# Patient Record
Sex: Female | Born: 1995 | Race: White | Hispanic: No | Marital: Single | Smoking: Never smoker
Health system: Southern US, Community
[De-identification: ages and names within clinical notes are randomized; demographics above are authoritative.]

## PROBLEM LIST (undated history)

## (undated) DIAGNOSIS — N83209 Unspecified ovarian cyst, unspecified side: Secondary | ICD-10-CM

## (undated) DIAGNOSIS — F32A Depression, unspecified: Secondary | ICD-10-CM

## (undated) DIAGNOSIS — D649 Anemia, unspecified: Secondary | ICD-10-CM

## (undated) DIAGNOSIS — G43909 Migraine, unspecified, not intractable, without status migrainosus: Secondary | ICD-10-CM

## (undated) DIAGNOSIS — J45909 Unspecified asthma, uncomplicated: Secondary | ICD-10-CM

## (undated) DIAGNOSIS — B009 Herpesviral infection, unspecified: Secondary | ICD-10-CM

## (undated) DIAGNOSIS — F329 Major depressive disorder, single episode, unspecified: Secondary | ICD-10-CM

## (undated) HISTORY — PX: TONSILLECTOMY: SUR1361

## (undated) HISTORY — PX: LAPAROSCOPIC OVARIAN CYSTECTOMY: SUR786

---

## 2017-08-11 ENCOUNTER — Other Ambulatory Visit: Payer: Self-pay

## 2017-08-11 ENCOUNTER — Emergency Department (HOSPITAL_BASED_OUTPATIENT_CLINIC_OR_DEPARTMENT_OTHER)
Admission: EM | Admit: 2017-08-11 | Discharge: 2017-08-12 | Disposition: A | Discharge status: Home / Self Care | Payer: BLUE CROSS/BLUE SHIELD | Attending: Emergency Medicine | Admitting: Emergency Medicine

## 2017-08-11 ENCOUNTER — Encounter (HOSPITAL_BASED_OUTPATIENT_CLINIC_OR_DEPARTMENT_OTHER): Payer: Self-pay | Admitting: Emergency Medicine

## 2017-08-11 DIAGNOSIS — R51 Headache: Secondary | ICD-10-CM | POA: Diagnosis present

## 2017-08-11 DIAGNOSIS — J45909 Unspecified asthma, uncomplicated: Secondary | ICD-10-CM | POA: Diagnosis not present

## 2017-08-11 DIAGNOSIS — G43009 Migraine without aura, not intractable, without status migrainosus: Secondary | ICD-10-CM

## 2017-08-11 DIAGNOSIS — R197 Diarrhea, unspecified: Secondary | ICD-10-CM | POA: Diagnosis not present

## 2017-08-11 HISTORY — DX: Unspecified asthma, uncomplicated: J45.909

## 2017-08-11 HISTORY — DX: Anemia, unspecified: D64.9

## 2017-08-11 HISTORY — DX: Major depressive disorder, single episode, unspecified: F32.9

## 2017-08-11 HISTORY — DX: Depression, unspecified: F32.A

## 2017-08-11 HISTORY — DX: Migraine, unspecified, not intractable, without status migrainosus: G43.909

## 2017-08-11 LAB — PREGNANCY, URINE: Preg Test, Ur: NEGATIVE

## 2017-08-11 LAB — URINALYSIS, ROUTINE W REFLEX MICROSCOPIC
BILIRUBIN URINE: NEGATIVE
GLUCOSE, UA: NEGATIVE mg/dL
HGB URINE DIPSTICK: NEGATIVE
Ketones, ur: 15 mg/dL — AB
Leukocytes, UA: NEGATIVE
Nitrite: NEGATIVE
Protein, ur: NEGATIVE mg/dL
SPECIFIC GRAVITY, URINE: 1.02 (ref 1.005–1.030)
pH: 6.5 (ref 5.0–8.0)

## 2017-08-11 MED ORDER — KETOROLAC TROMETHAMINE 30 MG/ML IJ SOLN
15.0000 mg | Freq: Once | INTRAMUSCULAR | Status: AC
  Administered 2017-08-11: 15 mg via INTRAVENOUS
  Filled 2017-08-11: qty 1

## 2017-08-11 MED ORDER — DICYCLOMINE HCL 10 MG/ML IM SOLN
20.0000 mg | Freq: Once | INTRAMUSCULAR | Status: AC
  Administered 2017-08-11: 20 mg via INTRAMUSCULAR
  Filled 2017-08-11: qty 2

## 2017-08-11 MED ORDER — DIPHENHYDRAMINE HCL 50 MG/ML IJ SOLN: 25.0000 mg | Freq: Once | INTRAMUSCULAR | Status: DC

## 2017-08-11 MED ORDER — SODIUM CHLORIDE 0.9 % IV BOLUS (SEPSIS)
1000.0000 mL | Freq: Once | INTRAVENOUS | Status: AC
  Administered 2017-08-11: 1000 mL via INTRAVENOUS

## 2017-08-11 MED ORDER — ONDANSETRON HCL 4 MG/2ML IJ SOLN
4.0000 mg | Freq: Once | INTRAMUSCULAR | Status: AC
  Administered 2017-08-11: 4 mg via INTRAVENOUS
  Filled 2017-08-11: qty 2

## 2017-08-11 NOTE — ED Triage Notes (Signed)
PT presents with c/o migraine for 3 days. PT states she received infusion yesterday but headache is back and worse.

## 2017-08-12 MED ORDER — METOCLOPRAMIDE HCL 10 MG PO TABS: 10.0000 mg | ORAL_TABLET | Freq: Four times a day (QID) | ORAL | 0 refills | Status: AC | PRN

## 2017-08-12 MED ORDER — MAGNESIUM SULFATE 2 GM/50ML IV SOLN
2.0000 g | Freq: Once | INTRAVENOUS | Status: AC
  Administered 2017-08-12: 2 g via INTRAVENOUS
  Filled 2017-08-12: qty 50

## 2017-08-12 MED ORDER — PROMETHAZINE HCL 25 MG/ML IJ SOLN
12.5000 mg | Freq: Once | INTRAMUSCULAR | Status: AC
  Administered 2017-08-12: 12.5 mg via INTRAVENOUS
  Filled 2017-08-12: qty 1

## 2017-08-12 MED ORDER — DIPHENHYDRAMINE HCL 25 MG PO CAPS
25.0000 mg | ORAL_CAPSULE | Freq: Once | ORAL | Status: AC
  Administered 2017-08-12: 25 mg via ORAL
  Filled 2017-08-12: qty 1

## 2017-08-12 MED ORDER — DEXAMETHASONE SODIUM PHOSPHATE 4 MG/ML IJ SOLN
4.0000 mg | Freq: Once | INTRAMUSCULAR | Status: AC
  Administered 2017-08-12: 4 mg via INTRAVENOUS
  Filled 2017-08-12: qty 1

## 2017-08-12 NOTE — ED Provider Notes (Signed)
[facility] EMERGENCY DEPARTMENT Provider Note   CSN: [id number] Arrival date & time: 08/11/17  2140     History   Chief Complaint Chief Complaint  Patient presents with  . Headache    HPI Whitney Parker is a 21 y.o. female.  The history is provided by the patient.  Headache   This is a recurrent problem. The current episode started more than 2 days ago. The problem occurs constantly. The problem has not changed since onset.The headache is associated with nothing. The pain is located in the temporal region. The quality of the pain is described as dull. The pain is severe. The pain does not radiate. Associated symptoms include nausea and vomiting. Pertinent negatives include no anorexia, no fever, no malaise/fatigue, no chest pressure and no syncope. Associated symptoms comments: Diarrhea.  Has known migraines and got an injection in the doctors office and was better but then today got worse with n/v/d.  No neck pain,  No focal neuro signs. Home medication not enough.  No sudden onset not the worst HA of her life.  No changes in vision or cognition. Treatments tried: phenergan  The treatment provided no relief.    Past Medical History:  Diagnosis Date  . Anemia   . Asthma   . Depression   . Migraines     There are no active problems to display for this patient.   Past Surgical History:  Procedure Laterality Date  . TONSILLECTOMY      OB History    No data available       Home Medications    Prior to Admission medications   Medication Sig Start Date End Date Taking? Authorizing Provider  metoCLOPramide (REGLAN) 10 MG tablet Take 1 tablet (10 mg total) every 6 (six) hours as needed by mouth for nausea (nausea/headache). 08/12/17   Brandie Lopes, MD    Family History No family history on file.  Social History Social History   Tobacco Use  . Smoking status: Never Smoker  . Smokeless tobacco: Never Used  Substance Use Topics  . Alcohol use: Yes   Frequency: Never  . Drug use: No     Allergies   Other; Penicillins; Percocet [oxycodone-acetaminophen]; Sulfa antibiotics; and Topamax [topiramate]   Review of Systems Review of Systems  Constitutional: Negative for fever and malaise/fatigue.  Eyes: Negative for pain, redness and visual disturbance.  Cardiovascular: Negative for syncope.  Gastrointestinal: Positive for diarrhea, nausea and vomiting. Negative for anorexia.  Musculoskeletal: Negative for neck pain and neck stiffness.  Skin: Negative for rash.  Neurological: Positive for headaches. Negative for dizziness, tremors, seizures, syncope, facial asymmetry, speech difficulty, weakness, light-headedness and numbness.  Psychiatric/Behavioral: Negative for confusion.  All other systems reviewed and are negative.    Physical Exam Updated Vital Signs BP 122/74 (BP Location: Right Arm)   Pulse 84   Temp 97.9 F (36.6 C) (Oral)   Resp 18   Ht 5\' 6"  (1.676 m)   Wt 63.5 kg (140 lb)   LMP 07/16/2017   SpO2 100%   BMI 22.60 kg/m   Physical Exam  Constitutional: She is oriented to person, place, and time. She appears well-developed and well-nourished.  Non-toxic appearance. She does not appear ill. No distress.  HENT:  Head: Normocephalic and atraumatic.  Mouth/Throat: Oropharynx is clear and moist.  Eyes: EOM are normal. Pupils are equal, round, and reactive to light.  No proptosis intact cognition  Neck: Normal range of motion. Neck supple. No JVD present.  No neck rigidity. No tracheal deviation present. No Brudzinski's sign and no Kernig's sign noted.  Cardiovascular: Normal rate, regular rhythm, normal heart sounds and intact distal pulses.  Pulmonary/Chest: Effort normal and breath sounds normal.  Abdominal: Soft. Bowel sounds are normal. There is no tenderness.  Musculoskeletal: Normal range of motion.  Lymphadenopathy:    She has no cervical adenopathy.  Neurological: She is alert and oriented to person, place,  and time. She has normal strength. She displays normal reflexes. No cranial nerve deficit. She displays a negative Romberg sign. GCS eye subscore is 4. GCS verbal subscore is 5. GCS motor subscore is 6.  Skin: Skin is warm and dry. Capillary refill takes less than 2 seconds.  Psychiatric: She has a normal mood and affect. Her behavior is normal.  Nursing note and vitals reviewed.    ED Treatments / Results   Vitals:   08/12/17 0211 08/12/17 0246  BP: 125/73 122/74  Pulse: 82 84  Resp: 16 18  Temp:    SpO2: 100% 100%    Radiology No results found.  Procedures Procedures (including critical care time)  Medications Ordered in ED Medications  ketorolac (TORADOL) 30 MG/ML injection 15 mg (15 mg Intravenous Given 08/11/17 2355)  sodium chloride 0.9 % bolus 1,000 mL (0 mLs Intravenous Stopped 08/12/17 0049)  ondansetron (ZOFRAN) injection 4 mg (4 mg Intravenous Given 08/11/17 2354)  dicyclomine (BENTYL) injection 20 mg (20 mg Intramuscular Given 08/11/17 2355)  dexamethasone (DECADRON) injection 4 mg (4 mg Intravenous Given 08/12/17 0029)  diphenhydrAMINE (BENADRYL) capsule 25 mg (25 mg Oral Given 08/12/17 0029)  magnesium sulfate IVPB 2 g 50 mL (0 g Intravenous Stopped 08/12/17 0209)  promethazine (PHENERGAN) injection 12.5 mg (12.5 mg Intravenous Given 08/12/17 0126)      Final Clinical Impressions(s) / ED Diagnoses   Final diagnoses:  Migraine without aura and without status migrainosus, not intractable  Diarrhea, unspecified type   Improved post medication.  Suspect viral induced HA on top of migraine give n/v.d.    All questions answered to the patient's satisfaction.    Strict return precautions for fever, global weakness, blood in the urine, abdominal distention, vomiting, no drainage from the foley catheter, swelling or the lips or tongue, chest pain, dyspnea on exertion, new weakness or numbness changes in vision or speech, fevers, weakness persistent pain, Inability  to tolerate liquids or food, changes in voice cough, altered mental status or any concerns. No signs of systemic illness or infection. The patient is nontoxic-appearing on exam and vital signs are within normal limits.    I have reviewed the triage vital signs and the nursing notes. Pertinent labs &imaging results that were available during my care of the patient were reviewed by me and considered in my medical decision making (see chart for details).  After history, exam, and medical workup I feel the patient has been appropriately medically screened and is safe for discharge home. Pertinent diagnoses were discussed with the patient. Patient was given return precautions ED Discharge Orders        Ordered    metoCLOPramide (REGLAN) 10 MG tablet  Every 6 hours PRN     08/12/17 0238       Domingos Riggi, MD 08/12/17 [phone]

## 2017-08-12 NOTE — ED Notes (Signed)
Pt discharged to home with family. NAD.  

## 2018-01-28 ENCOUNTER — Other Ambulatory Visit: Payer: Self-pay

## 2018-01-28 ENCOUNTER — Emergency Department (HOSPITAL_COMMUNITY)
Admission: EM | Admit: 2018-01-28 | Discharge: 2018-01-28 | Disposition: A | Discharge status: Home / Self Care | Payer: BLUE CROSS/BLUE SHIELD | Attending: Emergency Medicine | Admitting: Emergency Medicine

## 2018-01-28 ENCOUNTER — Emergency Department (HOSPITAL_COMMUNITY): Payer: BLUE CROSS/BLUE SHIELD

## 2018-01-28 ENCOUNTER — Encounter (HOSPITAL_COMMUNITY): Payer: Self-pay | Admitting: Emergency Medicine

## 2018-01-28 DIAGNOSIS — N10 Acute tubulo-interstitial nephritis: Secondary | ICD-10-CM | POA: Insufficient documentation

## 2018-01-28 DIAGNOSIS — Z79899 Other long term (current) drug therapy: Secondary | ICD-10-CM | POA: Insufficient documentation

## 2018-01-28 DIAGNOSIS — N939 Abnormal uterine and vaginal bleeding, unspecified: Secondary | ICD-10-CM | POA: Insufficient documentation

## 2018-01-28 DIAGNOSIS — R109 Unspecified abdominal pain: Secondary | ICD-10-CM | POA: Diagnosis present

## 2018-01-28 DIAGNOSIS — J45909 Unspecified asthma, uncomplicated: Secondary | ICD-10-CM | POA: Diagnosis not present

## 2018-01-28 LAB — COMPREHENSIVE METABOLIC PANEL
ALT: 10 U/L — ABNORMAL LOW (ref 14–54)
AST: 18 U/L (ref 15–41)
Albumin: 4 g/dL (ref 3.5–5.0)
Alkaline Phosphatase: 67 U/L (ref 38–126)
Anion gap: 9 (ref 5–15)
BUN: 8 mg/dL (ref 6–20)
CO2: 24 mmol/L (ref 22–32)
Calcium: 8.6 mg/dL — ABNORMAL LOW (ref 8.9–10.3)
Chloride: 104 mmol/L (ref 101–111)
Creatinine, Ser: 0.7 mg/dL (ref 0.44–1.00)
GFR calc Af Amer: >60 mL/min (ref >60)
GFR calc non Af Amer: >60 mL/min (ref >60)
Glucose, Bld: 99 mg/dL (ref 65–99)
Potassium: 3.9 mmol/L (ref 3.5–5.1)
Sodium: 137 mmol/L (ref 135–145)
Total Bilirubin: 0.8 mg/dL (ref 0.3–1.2)
Total Protein: 6.2 g/dL — ABNORMAL LOW (ref 6.5–8.1)

## 2018-01-28 LAB — CBC WITH DIFFERENTIAL/PLATELET
Basophils Absolute: 0 10*3/uL (ref 0.0–0.1)
Basophils Relative: 0 %
Eosinophils Absolute: 0.8 10*3/uL — ABNORMAL HIGH (ref 0.0–0.7)
Eosinophils Relative: 5 %
HCT: 30.9 % — ABNORMAL LOW (ref 36.0–46.0)
Hemoglobin: 9.9 g/dL — ABNORMAL LOW (ref 12.0–15.0)
Lymphocytes Relative: 15 %
Lymphs Abs: 2.4 10*3/uL (ref 0.7–4.0)
MCH: 19.9 pg — ABNORMAL LOW (ref 26.0–34.0)
MCHC: 32 g/dL (ref 30.0–36.0)
MCV: 62.2 fL — ABNORMAL LOW (ref 78.0–100.0)
Monocytes Absolute: 0.6 10*3/uL (ref 0.1–1.0)
Monocytes Relative: 4 %
Neutro Abs: 12.2 10*3/uL — ABNORMAL HIGH (ref 1.7–7.7)
Neutrophils Relative %: 76 %
Platelets: 316 10*3/uL (ref 150–400)
RBC: 4.97 MIL/uL (ref 3.87–5.11)
RDW: 14.9 % (ref 11.5–15.5)
WBC: 16 10*3/uL — ABNORMAL HIGH (ref 4.0–10.5)

## 2018-01-28 LAB — WET PREP, GENITAL
Sperm: NONE SEEN
Trich, Wet Prep: NONE SEEN
Yeast Wet Prep HPF POC: NONE SEEN

## 2018-01-28 LAB — URINALYSIS, ROUTINE W REFLEX MICROSCOPIC
Bilirubin Urine: NEGATIVE
Glucose, UA: NEGATIVE mg/dL
Ketones, ur: NEGATIVE mg/dL
NITRITE: NEGATIVE
PH: 5 (ref 5.0–8.0)
Protein, ur: NEGATIVE mg/dL
SPECIFIC GRAVITY, URINE: 1.014 (ref 1.005–1.030)

## 2018-01-28 LAB — LIPASE, BLOOD: Lipase: 26 U/L (ref 11–51)

## 2018-01-28 LAB — POC URINE PREG, ED: Preg Test, Ur: NEGATIVE

## 2018-01-28 MED ORDER — CEPHALEXIN 500 MG PO CAPS: 500.0000 mg | ORAL_CAPSULE | Freq: Four times a day (QID) | ORAL | 0 refills | Status: AC

## 2018-01-28 MED ORDER — ONDANSETRON 4 MG PO TBDP: 4.0000 mg | ORAL_TABLET | Freq: Three times a day (TID) | ORAL | 0 refills | Status: DC | PRN

## 2018-01-28 MED ORDER — SODIUM CHLORIDE 0.9 % IV BOLUS
1000.0000 mL | Freq: Once | INTRAVENOUS | Status: AC
  Administered 2018-01-28: 1000 mL via INTRAVENOUS

## 2018-01-28 MED ORDER — PROMETHAZINE HCL 25 MG/ML IJ SOLN
12.5000 mg | Freq: Once | INTRAMUSCULAR | Status: AC
  Administered 2018-01-28: 12.5 mg via INTRAVENOUS
  Filled 2018-01-28: qty 1

## 2018-01-28 MED ORDER — ONDANSETRON HCL 4 MG/2ML IJ SOLN
4.0000 mg | Freq: Once | INTRAMUSCULAR | Status: AC
  Administered 2018-01-28: 4 mg via INTRAVENOUS
  Filled 2018-01-28: qty 2

## 2018-01-28 MED ORDER — SODIUM CHLORIDE 0.9 % IV SOLN
1.0000 g | Freq: Once | INTRAVENOUS | Status: AC
  Administered 2018-01-28: 1 g via INTRAVENOUS
  Filled 2018-01-28: qty 10

## 2018-01-28 MED ORDER — KETOROLAC TROMETHAMINE 30 MG/ML IJ SOLN
30.0000 mg | Freq: Once | INTRAMUSCULAR | Status: AC
  Administered 2018-01-28: 30 mg via INTRAVENOUS
  Filled 2018-01-28: qty 1

## 2018-01-28 NOTE — ED Notes (Signed)
Patient transported to CT 

## 2018-01-28 NOTE — ED Provider Notes (Signed)
[facility] EMERGENCY DEPARTMENT Provider Note   CSN: [id number] Arrival date & time: 01/28/18  4098     History   Chief Complaint Chief Complaint  Patient presents with  . Abdominal Pain  . Back Pain  . Dysuria    HPI Whitney Parker is a 22 y.o. female with history of anemia, asthma, depression, and migraines presents today for evaluation of urinary symptoms for 3 days and acute onset, per aggressively worsening back pain and right flank pain since yesterday.  She states that for the past 3 days she has experienced dysuria, urgency, and frequency.  She also notes mild suprapubic pressure.  Yesterday she developed generalized myalgias and chills, aching midline low back pain and right flank pain.  No radiation down the lower extremity's.  Pain worsens with bending and position changes.  She has experienced 4 episodes of nonbloody nonbilious emesis and endorses ongoing nausea.  She denies diarrhea but endorses constipation and states this is normal for her because she has IBS.  She has been expensing abnormal vaginal bleeding additionally.  She states that she did not have a menstrual cycle for 2 months and then bled for 2 weeks straight from 01/02/2018 through 01/18/2018.  She states she began having vaginal spotting again today.  She does take progesterone-based OCPs and states that her menstrual cycle was regulated for a long time but has been more irregular as of late.  She is currently sexually active with one female partner but does not always use protection.  She declines testing for HIV or syphilis today and states she does not think she has an STD.  She has not tried anything for her symptoms.  The history is provided by the patient.    Past Medical History:  Diagnosis Date  . Anemia   . Asthma   . Depression   . Migraines     There are no active problems to display for this patient.   Past Surgical History:  Procedure Laterality Date  . TONSILLECTOMY        OB History   None      Home Medications    Prior to Admission medications   Medication Sig Start Date End Date Taking? Authorizing Provider  montelukast (SINGULAIR) 10 MG tablet Take 10 mg by mouth at bedtime.   Yes [provider]  norethindrone (MICRONOR,CAMILA,ERRIN) 0.35 MG tablet Take 1 tablet by mouth daily.   Yes [provider]  PARoxetine (PAXIL) 20 MG tablet Take 20 mg by mouth daily.   Yes [provider]  tiZANidine (ZANAFLEX) 4 MG tablet Take 4-8 mg by mouth at bedtime. 06/01/17  Yes [provider]  zonisamide (ZONEGRAN) 100 MG capsule Take 500 mg by mouth at bedtime. 12/20/17  Yes [provider]  cephALEXin (KEFLEX) 500 MG capsule Take 1 capsule (500 mg total) by mouth 4 (four) times daily for 10 days. 01/28/18 02/07/18  Michela Pitcher A, PA-C  metoCLOPramide (REGLAN) 10 MG tablet Take 1 tablet (10 mg total) every 6 (six) hours as needed by mouth for nausea (nausea/headache). Patient not taking: Reported on 01/28/2018 08/12/17   Palumbo, April, MD  ondansetron (ZOFRAN ODT) 4 MG disintegrating tablet Take 1 tablet (4 mg total) by mouth every 8 (eight) hours as needed for nausea or vomiting. 01/28/18   Jeanie Sewer, PA-C    Family History No family history on file.  Social History Social History   Tobacco Use  . Smoking status: Never Smoker  .  Smokeless tobacco: Never Used  Substance Use Topics  . Alcohol use: Yes    Frequency: Never  . Drug use: No     Allergies   Compazine [prochlorperazine edisylate]; Other; Penicillins; Percocet [oxycodone-acetaminophen]; Sulfa antibiotics; and Topamax [topiramate]   Review of Systems Review of Systems  Constitutional: Positive for chills and fatigue. Negative for fever.  Respiratory: Negative for shortness of breath.   Cardiovascular: Negative for chest pain.  Gastrointestinal: Positive for abdominal pain, nausea and vomiting. Negative for diarrhea.  Genitourinary: Positive for  dysuria, flank pain, frequency, urgency and vaginal bleeding. Negative for hematuria, vaginal discharge and vaginal pain.  Musculoskeletal: Positive for arthralgias.  All other systems reviewed and are negative.    Physical Exam Updated Vital Signs BP 118/74   Pulse 95   Temp 98 F (36.7 C) (Oral)   Resp 16   Ht  (1.676 m)   Wt 59 kg (130 lb)   LMP 01/17/2018   SpO2 98%   BMI 20.98 kg/m   Physical Exam  Constitutional: She appears well-developed and well-nourished. No distress.  HENT:  Head: Normocephalic and atraumatic.  Eyes: Conjunctivae are normal. Right eye exhibits no discharge. Left eye exhibits no discharge.  Neck: No JVD present. No tracheal deviation present.  Cardiovascular: Normal rate, regular rhythm, normal heart sounds and intact distal pulses.  Pulmonary/Chest: Effort normal and breath sounds normal. No stridor. No respiratory distress.  Abdominal: Soft. Normal appearance and bowel sounds are normal. She exhibits no distension. There is tenderness in the suprapubic area and left lower quadrant. There is CVA tenderness. There is no rigidity, no rebound, no guarding, no tenderness at McBurney's point and negative Murphy's sign.  Right CVA tenderness noted  Genitourinary: Rectum normal and uterus normal. Cervix exhibits no motion tenderness and no friability. Right adnexum displays no tenderness. Left adnexum displays no tenderness. There is bleeding in the vagina.  Genitourinary Comments: Examination performed in the presence of a chaperone.  No masses or lesions noted to the external genitalia.  There is a moderate amount of blood in the vaginal vault.  No other abnormal discharge noted.  No cervical motion tenderness, no adnexal tenderness.  Musculoskeletal: She exhibits no edema.  Diffuse lumbar pain on palpation, worse on the right.  No deformity, crepitus, or step-off noted.  5/5 strength of BLE major muscle groups.  Neurological: She is alert.  Skin: Skin  is warm and dry. No erythema.  Psychiatric: She has a normal mood and affect. Her behavior is normal.  Nursing note and vitals reviewed.    ED Treatments / Results  Labs (all labs ordered are listed, but only abnormal results are displayed) Labs Reviewed  URINE CULTURE - Abnormal; Notable for the following components:      Result Value   Culture MULTIPLE SPECIES PRESENT, SUGGEST RECOLLECTION (*)    All other components within normal limits  WET PREP, GENITAL - Abnormal; Notable for the following components:   Clue Cells Wet Prep HPF POC PRESENT (*)    WBC, Wet Prep HPF POC MODERATE (*)    All other components within normal limits  URINALYSIS, ROUTINE W REFLEX MICROSCOPIC - Abnormal; Notable for the following components:   APPearance HAZY (*)    Hgb urine dipstick LARGE (*)    Leukocytes, UA LARGE (*)    WBC, UA >50 (*)    Bacteria, UA MANY (*)    All other components within normal limits  COMPREHENSIVE METABOLIC PANEL - Abnormal; Notable for the following  components:   Calcium 8.6 (*)    Total Protein 6.2 (*)    ALT 10 (*)    All other components within normal limits  CBC WITH DIFFERENTIAL/PLATELET - Abnormal; Notable for the following components:   WBC 16.0 (*)    Hemoglobin 9.9 (*)    HCT 30.9 (*)    MCV 62.2 (*)    MCH 19.9 (*)    Neutro Abs 12.2 (*)    Eosinophils Absolute 0.8 (*)    All other components within normal limits  LIPASE, BLOOD  POC URINE PREG, ED  GC/CHLAMYDIA PROBE AMP ([place]) NOT AT [facility]    EKG None  Radiology Ct Renal Stone Study  Result Date: 01/28/2018 CLINICAL DATA:  22 year old female with history of urinary tract infection. EXAM: CT ABDOMEN AND PELVIS WITHOUT CONTRAST TECHNIQUE: Multidetector CT imaging of the abdomen and pelvis was performed following the standard protocol without IV contrast. COMPARISON:  No priors. FINDINGS: Lower chest: Unremarkable. Hepatobiliary: No definite cystic or solid hepatic lesions noted on today's  noncontrast CT examination. Unenhanced appearance of the gallbladder is normal. Pancreas: No definite pancreatic mass or peripancreatic fluid or inflammatory changes are noted on today's noncontrast CT examination. Spleen: Unremarkable. Adrenals/Urinary Tract: 3 mm nonobstructive calculus in the interpolar collecting system of the left kidney. No additional calculi are noted within the right renal collecting system, along the course of either ureter, or within the lumen of the urinary bladder. No hydroureteronephrosis or perinephric stranding to suggest urinary tract obstruction at this time. Unenhanced appearance of the kidneys, bilateral adrenal glands and urinary bladder is otherwise unremarkable. Stomach/Bowel: Unenhanced appearance of the stomach is normal. There is no pathologic dilatation of small bowel or colon. Normal appendix. Vascular/Lymphatic: No atherosclerotic calcifications are. No lymphadenopathy noted in the abdominal or pelvic vasculature noted in the abdomen or pelvis. Reproductive: Uterus and ovaries are unremarkable in appearance. Other: No significant volume of ascites.  No pneumoperitoneum. Musculoskeletal: There are no aggressive appearing lytic or blastic lesions noted in the visualized portions of the skeleton. IMPRESSION: 1. No acute findings are noted in the abdomen or pelvis to account for the patient's symptoms. 2. 3 mm nonobstructive calculus in the interpolar collecting system of left kidney. No ureteral stones or findings of urinary tract obstruction are noted at this time. Electronically Signed   By: Trudie Reed M.D.   On: 01/28/2018 14:55    Procedures Procedures (including critical care time)  Medications Ordered in ED Medications  sodium chloride 0.9 % bolus 1,000 mL (0 mLs Intravenous Stopped 01/28/18 1318)  promethazine (PHENERGAN) injection 12.5 mg (12.5 mg Intravenous Given 01/28/18 1157)  ketorolac (TORADOL) 30 MG/ML injection 30 mg (30 mg Intravenous Given  01/28/18 1322)  cefTRIAXone (ROCEPHIN) 1 g in sodium chloride 0.9 % 100 mL IVPB (0 g Intravenous Stopped 01/28/18 1602)  ondansetron (ZOFRAN) injection 4 mg (4 mg Intravenous Given 01/28/18 1532)     Initial Impression / Assessment and Plan / ED Course  I have reviewed the triage vital signs and the nursing notes.  Pertinent labs & imaging results that were available during my care of the patient were reviewed by me and considered in my medical decision making (see chart for details).     Patient presents for evaluation of 3 days of urinary symptoms and 1 day of back pain nausea, and vomiting.  She has also been having abnormal vaginal bleeding intermittently for months.  She is afebrile, mildly tachycardic in the ED but vital signs are  otherwise stable.  She is somewhat uncomfortable but nontoxic in appearance.  She has right-sided CVA tenderness and mild suprapubic pressure on examination.  She has small amount of vaginal bleeding on pelvic examination but no cervical motion tenderness or adnexal tenderness to suggest PID.  Pregnancy test is negative and I doubt ectopic pregnancy as a result.  UA is consistent with UTI, will send for culture.  With concern for pyelonephritis given the patient's constitutional symptoms and back pain, we will obtain CT renal stone study to rule out abscess or complication.  We will also give fluids, Toradol, and nausea medicine.  CT scan shows a 3 mm nonobstructing kidney stone in the left kidney.  Remainder of lab work reviewed by me significant for a leukocytosis of 16, and clue cells present on wet prep.  However since patient is not complaining of vaginal discharge and clue cells are physiologic, I do not see an emergent need to treat for BV.  I doubt obstruction, perforation, appendicitis, colitis, TOA, ovarian torsion, or other acute surgical abdominal pathology. She was given IV Rocephin in the ED which she tolerated without difficulty.  In the past she has had  itching with amoxicillin but no evidence of cross-reactivity today.  Will discharge with Keflex, Zofran, and I have instructed her to follow-up with her primary care physician or OB/GYN for reevaluation of her current symptoms and her abnormal vaginal bleeding.  Suspect this may be secondary to her progesterone only OCPs.  On reevaluation, the patient is resting comfortably in no apparent distress, tolerating p.o. food and fluids without difficulty.  Serial abdominal examinations remain benign.  Discussed strict ED return precautions. Pt verbalized understanding of and agreement with plan and is safe for discharge home at this time.   Final Clinical Impressions(s) / ED Diagnoses   Final diagnoses:  Abnormal vaginal bleeding  Acute pyelonephritis    ED Discharge Orders        Ordered    cephALEXin (KEFLEX) 500 MG capsule  4 times daily     01/28/18 1638    ondansetron (ZOFRAN ODT) 4 MG disintegrating tablet  Every 8 hours PRN     01/28/18 1638       Mykell Genao, [place] A, PA-C 01/30/18 1020    Terrilee Files, MD 01/30/18 1705

## 2018-01-28 NOTE — Discharge Instructions (Addendum)
Please take all of your antibiotics until finished!   You may develop abdominal discomfort or diarrhea from the antibiotic.  You may help offset this with probiotics which you can buy or get in yogurt. Do not eat  or take the probiotics until 2 hours after your antibiotic.   Alternate 600 mg of ibuprofen and [phone] mg of Tylenol every 3 hours as needed for pain. Do not exceed 4000 mg of Tylenol daily.  Take ibuprofen with food to avoid upset stomach issues.  Take Zofran as needed for nausea.  Wait around 20 minutes to give this medication time to work.  Eat a diet of bland foods that will not upset your stomach.  Call your primary care physician and set up a follow-up appointment sometime this week for reevaluation of your current symptoms as well as your abnormal vaginal bleeding.  Return to the emergency department for any concerning signs or symptoms or worsening symptoms such as fevers, persistent vomiting, or worsening pain.

## 2018-01-28 NOTE — ED Triage Notes (Signed)
Pt. Stated, I have a UTI, I have them a lot. My back, stomach legs all hurt and it burns when I pee, started yesterday

## 2018-01-29 LAB — URINE CULTURE

## 2018-01-29 LAB — GC/CHLAMYDIA PROBE AMP (~~LOC~~) NOT AT ARMC
Chlamydia: NEGATIVE
Neisseria Gonorrhea: NEGATIVE

## 2018-10-31 ENCOUNTER — Emergency Department (HOSPITAL_BASED_OUTPATIENT_CLINIC_OR_DEPARTMENT_OTHER)
Admission: EM | Admit: 2018-10-31 | Discharge: 2018-11-01 | Disposition: A | Discharge status: Home / Self Care | Payer: BLUE CROSS/BLUE SHIELD | Attending: Emergency Medicine | Admitting: Emergency Medicine

## 2018-10-31 ENCOUNTER — Emergency Department (HOSPITAL_BASED_OUTPATIENT_CLINIC_OR_DEPARTMENT_OTHER): Payer: BLUE CROSS/BLUE SHIELD

## 2018-10-31 ENCOUNTER — Encounter (HOSPITAL_BASED_OUTPATIENT_CLINIC_OR_DEPARTMENT_OTHER): Payer: Self-pay | Admitting: Emergency Medicine

## 2018-10-31 ENCOUNTER — Other Ambulatory Visit: Payer: Self-pay

## 2018-10-31 DIAGNOSIS — R102 Pelvic and perineal pain unspecified side: Secondary | ICD-10-CM

## 2018-10-31 DIAGNOSIS — Z91040 Latex allergy status: Secondary | ICD-10-CM | POA: Diagnosis not present

## 2018-10-31 DIAGNOSIS — R1032 Left lower quadrant pain: Secondary | ICD-10-CM | POA: Diagnosis not present

## 2018-10-31 DIAGNOSIS — K5900 Constipation, unspecified: Secondary | ICD-10-CM | POA: Diagnosis not present

## 2018-10-31 DIAGNOSIS — J45909 Unspecified asthma, uncomplicated: Secondary | ICD-10-CM | POA: Diagnosis not present

## 2018-10-31 DIAGNOSIS — R110 Nausea: Secondary | ICD-10-CM | POA: Insufficient documentation

## 2018-10-31 DIAGNOSIS — Z880 Allergy status to penicillin: Secondary | ICD-10-CM | POA: Diagnosis not present

## 2018-10-31 DIAGNOSIS — Z8742 Personal history of other diseases of the female genital tract: Secondary | ICD-10-CM | POA: Diagnosis not present

## 2018-10-31 DIAGNOSIS — Z79899 Other long term (current) drug therapy: Secondary | ICD-10-CM | POA: Diagnosis not present

## 2018-10-31 HISTORY — DX: Unspecified ovarian cyst, unspecified side: N83.209

## 2018-10-31 LAB — URINALYSIS, ROUTINE W REFLEX MICROSCOPIC
Bilirubin Urine: NEGATIVE
Glucose, UA: NEGATIVE mg/dL
Ketones, ur: NEGATIVE mg/dL
Nitrite: NEGATIVE
PH: 7 (ref 5.0–8.0)
Protein, ur: NEGATIVE mg/dL
Specific Gravity, Urine: 1.01 (ref 1.005–1.030)

## 2018-10-31 LAB — CBC WITH DIFFERENTIAL/PLATELET
Abs Immature Granulocytes: 0.02 10*3/uL (ref 0.00–0.07)
Basophils Absolute: 0.1 10*3/uL (ref 0.0–0.1)
Basophils Relative: 1 %
EOS PCT: 9 %
Eosinophils Absolute: 0.9 10*3/uL — ABNORMAL HIGH (ref 0.0–0.5)
HCT: 34.7 % — ABNORMAL LOW (ref 36.0–46.0)
Hemoglobin: 10.5 g/dL — ABNORMAL LOW (ref 12.0–15.0)
Immature Granulocytes: 0 %
Lymphocytes Relative: 32 %
Lymphs Abs: 3.3 10*3/uL (ref 0.7–4.0)
MCH: 19.4 pg — ABNORMAL LOW (ref 26.0–34.0)
MCHC: 30.3 g/dL (ref 30.0–36.0)
MCV: 64.1 fL — ABNORMAL LOW (ref 80.0–100.0)
MONO ABS: 0.6 10*3/uL (ref 0.1–1.0)
Monocytes Relative: 6 %
Neutro Abs: 5.5 10*3/uL (ref 1.7–7.7)
Neutrophils Relative %: 52 %
Platelets: 346 10*3/uL (ref 150–400)
RBC: 5.41 MIL/uL — ABNORMAL HIGH (ref 3.87–5.11)
RDW: 16.1 % — ABNORMAL HIGH (ref 11.5–15.5)
Smear Review: NORMAL
WBC: 10.4 10*3/uL (ref 4.0–10.5)
nRBC: 0 % (ref 0.0–0.2)

## 2018-10-31 LAB — COMPREHENSIVE METABOLIC PANEL
ALT: 14 U/L (ref 0–44)
AST: 20 U/L (ref 15–41)
Albumin: 4.2 g/dL (ref 3.5–5.0)
Alkaline Phosphatase: 61 U/L (ref 38–126)
Anion gap: 5 (ref 5–15)
BILIRUBIN TOTAL: 0.7 mg/dL (ref 0.3–1.2)
BUN: 12 mg/dL (ref 6–20)
CO2: 23 mmol/L (ref 22–32)
Calcium: 8.8 mg/dL — ABNORMAL LOW (ref 8.9–10.3)
Chloride: 106 mmol/L (ref 98–111)
Creatinine, Ser: 0.84 mg/dL (ref 0.44–1.00)
GFR calc non Af Amer: >60 mL/min (ref >60)
Glucose, Bld: 98 mg/dL (ref 70–99)
Potassium: 3.7 mmol/L (ref 3.5–5.1)
Sodium: 134 mmol/L — ABNORMAL LOW (ref 135–145)
Total Protein: 6.7 g/dL (ref 6.5–8.1)

## 2018-10-31 LAB — PREGNANCY, URINE: Preg Test, Ur: NEGATIVE

## 2018-10-31 LAB — URINALYSIS, MICROSCOPIC (REFLEX)

## 2018-10-31 LAB — LIPASE, BLOOD: Lipase: 37 U/L (ref 11–51)

## 2018-10-31 MED ORDER — KETOROLAC TROMETHAMINE 30 MG/ML IJ SOLN
30.0000 mg | Freq: Once | INTRAMUSCULAR | Status: AC
  Administered 2018-10-31: 30 mg via INTRAVENOUS
  Filled 2018-10-31: qty 1

## 2018-10-31 NOTE — ED Notes (Signed)
Patient transported to Ultrasound 

## 2018-10-31 NOTE — ED Notes (Signed)
Pt states she gave a urine sample already and placed it in a bin in the waiting room. Pt is upset that her urine was apparently lost. Asked pt if she could give another sample, pt told nurse that she has "already peed like three times and I can't go again."

## 2018-10-31 NOTE — ED Provider Notes (Signed)
[facility] EMERGENCY DEPARTMENT Provider Note   CSN: [id number] Arrival date & time: 10/31/18  [id number]     History   Chief Complaint Chief Complaint  Patient presents with  . r/o ovarian torsion    HPI Whitney Parker is a 23 y.o. female.  22yo F w/ h/o IBS, asthma, migraines, R ovarian cystectomy who p/w LLQ pain. Pt reports several weeks of intermittent left lower quadrant pain.  She scheduled an appointment with her OB/GYN tomorrow but this morning around 11 AM she began having more severe left lower quadrant pain that has been constant.  She was running a low-grade fever at 99.5 orally this afternoon and her OB told her to go to the ER for evaluation.  She finished a course of macrobid 3 days ago for bladder infection. No current dysuria or hematuria and she reports previous back pain has resolved. She reports nausea and constipation, no vomiting or diarrhea.  The history is provided by the patient.    Past Medical History:  Diagnosis Date  . Anemia   . Asthma   . Depression   . Migraines   . Ovarian cyst     There are no active problems to display for this patient.   Past Surgical History:  Procedure Laterality Date  . LAPAROSCOPIC OVARIAN CYSTECTOMY    . TONSILLECTOMY       OB History   No obstetric history on file.      Home Medications    Prior to Admission medications   Medication Sig Start Date End Date Taking? Authorizing Provider  metoCLOPramide (REGLAN) 10 MG tablet Take 1 tablet (10 mg total) every 6 (six) hours as needed by mouth for nausea (nausea/headache). Patient not taking: Reported on 01/28/2018 08/12/17   Palumbo, April, MD  montelukast (SINGULAIR) 10 MG tablet Take 10 mg by mouth at bedtime.    [provider]  norethindrone (MICRONOR,CAMILA,ERRIN) 0.35 MG tablet Take 1 tablet by mouth daily.    [provider]  ondansetron (ZOFRAN ODT) 4 MG disintegrating tablet Take 1 tablet (4 mg total) by mouth every 8 (eight)  hours as needed for nausea or vomiting. 01/28/18   Luevenia MaxinFawze, Mina A, PA-C  PARoxetine (PAXIL) 20 MG tablet Take 20 mg by mouth daily.    [provider]  tiZANidine (ZANAFLEX) 4 MG tablet Take 4-8 mg by mouth at bedtime. 06/01/17   [provider]  zonisamide (ZONEGRAN) 100 MG capsule Take 500 mg by mouth at bedtime. 12/20/17   [provider]    Family History No family history on file.  Social History Social History   Tobacco Use  . Smoking status: Never Smoker  . Smokeless tobacco: Never Used  Substance Use Topics  . Alcohol use: Yes    Frequency: Never  . Drug use: No     Allergies   Compazine [prochlorperazine edisylate]; Latex; Other; Penicillins; Percocet [oxycodone-acetaminophen]; Sulfa antibiotics; Tape; and Topamax [topiramate]   Review of Systems Review of Systems All other systems reviewed and are negative except that which was mentioned in HPI   Physical Exam Updated Vital Signs BP 122/74 (BP Location: Left Arm)   Pulse 84   Temp 98.4 F (36.9 C) (Oral)   Resp 16   Ht 5\' 6"  (1.676 m)   Wt 66.2 kg   LMP  (Within Months) Comment: No periods with the iud  SpO2 98%   BMI 23.57 kg/m   Physical Exam Vitals signs and nursing note reviewed.  Constitutional:  General: She is not in acute distress.    Appearance: She is well-developed.  HENT:     Head: Normocephalic and atraumatic.  Eyes:     Conjunctiva/sclera: Conjunctivae normal.  Neck:     Musculoskeletal: Neck supple.  Cardiovascular:     Rate and Rhythm: Normal rate and regular rhythm.     Heart sounds: Normal heart sounds. No murmur.  Pulmonary:     Effort: Pulmonary effort is normal.     Breath sounds: Normal breath sounds.  Abdominal:     General: Bowel sounds are normal. There is no distension.     Palpations: Abdomen is soft.     Tenderness: There is abdominal tenderness.     Comments: Very mild suprapubic and LLQ TTP  Skin:    General: Skin is warm and dry.    Neurological:     Mental Status: She is alert and oriented to person, place, and time.     Comments: Fluent speech  Psychiatric:        Judgment: Judgment normal.      ED Treatments / Results  Labs (all labs ordered are listed, but only abnormal results are displayed) Labs Reviewed  URINALYSIS, ROUTINE W REFLEX MICROSCOPIC - Abnormal; Notable for the following components:      Result Value   APPearance HAZY (*)    Hgb urine dipstick TRACE (*)    Leukocytes, UA TRACE (*)    All other components within normal limits  COMPREHENSIVE METABOLIC PANEL - Abnormal; Notable for the following components:   Sodium 134 (*)    Calcium 8.8 (*)    All other components within normal limits  CBC WITH DIFFERENTIAL/PLATELET - Abnormal; Notable for the following components:   RBC 5.41 (*)    Hemoglobin 10.5 (*)    HCT 34.7 (*)    MCV 64.1 (*)    MCH 19.4 (*)    RDW 16.1 (*)    Eosinophils Absolute 0.9 (*)    All other components within normal limits  URINALYSIS, MICROSCOPIC (REFLEX) - Abnormal; Notable for the following components:   Bacteria, UA FEW (*)    All other components within normal limits  PREGNANCY, URINE  LIPASE, BLOOD    EKG None  Radiology US Renal  Result Date: 10/31/2018 CLINICAL DATA:  Left-sided pain since 11 o'clock this morning today. EXAM: RENAL / URINARY TRACT ULTRASOUND COMPLETE COMPARISON:  CT abdomen and pelvis 01/28/2018 FINDINGS: Right Kidney: Renal measurements: 10.2 x 4.4 x 4.3 cm = volume: 193 mL . Echogenicity within normal limits. No mass or hydronephrosis visualized. Left Kidney: Renal measurements: 10.5 x 4.8 x 5.2 cm = volume: 262 mL. Echogenicity within normal limits. No mass or hydronephrosis visualized. Bladder: Appears normal for degree of bladder distention. IMPRESSION: Normal ultrasound appearance of the kidneys and bladder. No hydronephrosis. Electronically Signed   By: Burman Nieves M.D.   On: 10/31/2018 22:18   US Pelvic Doppler (torsion R/o  Or Mass Arterial Flow)  Result Date: 10/31/2018 CLINICAL DATA:  Left lower quadrant pain for 2 days. IUD since September 2019. Right ovarian cystectomy 2019. EXAM: TRANSABDOMINAL AND TRANSVAGINAL ULTRASOUND OF PELVIS DOPPLER ULTRASOUND OF OVARIES TECHNIQUE: Both transabdominal and transvaginal ultrasound examinations of the pelvis were performed. Transabdominal technique was performed for global imaging of the pelvis including uterus, ovaries, adnexal regions, and pelvic cul-de-sac. It was necessary to proceed with endovaginal exam following the transabdominal exam to visualize the ovaries and endometrium. Color and duplex Doppler ultrasound was utilized to evaluate blood  flow to the ovaries. COMPARISON:  None. FINDINGS: Uterus Measurements: 5.9 x 3.1 x 4.1 cm = volume: 75 mL. Uterus appears retroverted and anteflexed. No myometrial mass lesions identified. Cervix is unremarkable. Endometrium Thickness: 3 mm. Echogenic stripe within the endometrium consistent with intrauterine device. Positioning appears appropriate. Right ovary Measurements: 3.4 x 2.4 x 1.7 cm = volume: 14 mL. Normal appearance/no adnexal mass. Left ovary Measurements: 4.1 x 2.9 x 1.8 cm = volume: 21 mL. Normal appearance/no adnexal mass. Pulsed Doppler evaluation of both ovaries demonstrates normal low-resistance arterial and venous waveforms. Other findings No abnormal free fluid. IMPRESSION: Intrauterine device is present with appropriate positioning. Uterus and ovaries are otherwise normal. Electronically Signed   By: Burman NievesWilliam  Stevens M.D.   On: 10/31/2018 19:50   [place] Pelvic Complete With Transvaginal  Result Date: 10/31/2018 CLINICAL DATA:  Left lower quadrant pain for 2 days. IUD since September 2019. Right ovarian cystectomy 2019. EXAM: TRANSABDOMINAL AND TRANSVAGINAL ULTRASOUND OF PELVIS DOPPLER ULTRASOUND OF OVARIES TECHNIQUE: Both transabdominal and transvaginal ultrasound examinations of the pelvis were performed. Transabdominal  technique was performed for global imaging of the pelvis including uterus, ovaries, adnexal regions, and pelvic cul-de-sac. It was necessary to proceed with endovaginal exam following the transabdominal exam to visualize the ovaries and endometrium. Color and duplex Doppler ultrasound was utilized to evaluate blood flow to the ovaries. COMPARISON:  None. FINDINGS: Uterus Measurements: 5.9 x 3.1 x 4.1 cm = volume: 75 mL. Uterus appears retroverted and anteflexed. No myometrial mass lesions identified. Cervix is unremarkable. Endometrium Thickness: 3 mm. Echogenic stripe within the endometrium consistent with intrauterine device. Positioning appears appropriate. Right ovary Measurements: 3.4 x 2.4 x 1.7 cm = volume: 14 mL. Normal appearance/no adnexal mass. Left ovary Measurements: 4.1 x 2.9 x 1.8 cm = volume: 21 mL. Normal appearance/no adnexal mass. Pulsed Doppler evaluation of both ovaries demonstrates normal low-resistance arterial and venous waveforms. Other findings No abnormal free fluid. IMPRESSION: Intrauterine device is present with appropriate positioning. Uterus and ovaries are otherwise normal. Electronically Signed   By: Burman NievesWilliam  Stevens M.D.   On: 10/31/2018 19:50    Procedures Procedures (including critical care time)  Medications Ordered in ED Medications  ketorolac (TORADOL) 30 MG/ML injection 30 mg (30 mg Intravenous Given 10/31/18 2324)     Initial Impression / Assessment and Plan / ED Course  I have reviewed the triage vital signs and the nursing notes.  Pertinent labs & imaging results that were available during my care of the patient were reviewed by me and considered in my medical decision making (see chart for details).     Well-appearing on exam, normal vital signs.  Lab work unremarkable, UPT negative and UA without signs of infection or hematuria.  Pelvic ultrasound is unremarkable, no evidence of torsion or cyst. DDx includes kidney stone, discussed imaging options with  patient and her mom and they agreed to renal US. Renal US normal. She has f/u appt with GYN tomorrow. Deferred pelvic exam to OBGYN given no vaginal discharge and reassuring pelvic exam. Discussed f/u with PCP if GYN eval unremarkable.  Extensively reviewed return precautions and she voiced understanding. Final Clinical Impressions(s) / ED Diagnoses   Final diagnoses:  Pelvic pain  LLQ abdominal pain    ED Discharge Orders    None       Gurtaj Ruz, Ambrose Finlandachel Morgan, MD 10/31/18 2351

## 2018-10-31 NOTE — ED Triage Notes (Signed)
LLQ pain for a few weeks that was not severe.  Today having severe LLQ pain since 11am. Planned to go to [facility] tomorrow but started running a fever of 99.5 orally this afternoon and OB told her to go to the ED to r/o ovarian torsion.

## 2018-11-01 NOTE — ED Notes (Signed)
Pt verbalizes understanding of d/c instructions and denies any further needs at this time. 

## 2020-01-05 ENCOUNTER — Other Ambulatory Visit: Payer: Self-pay

## 2020-01-05 ENCOUNTER — Encounter (HOSPITAL_BASED_OUTPATIENT_CLINIC_OR_DEPARTMENT_OTHER): Payer: Self-pay | Admitting: Emergency Medicine

## 2020-01-05 ENCOUNTER — Emergency Department (HOSPITAL_BASED_OUTPATIENT_CLINIC_OR_DEPARTMENT_OTHER)
Admission: EM | Admit: 2020-01-05 | Discharge: 2020-01-05 | Disposition: A | Discharge status: Home / Self Care | Payer: BC Managed Care – PPO | Attending: Emergency Medicine | Admitting: Emergency Medicine

## 2020-01-05 DIAGNOSIS — N760 Acute vaginitis: Secondary | ICD-10-CM | POA: Insufficient documentation

## 2020-01-05 DIAGNOSIS — N739 Female pelvic inflammatory disease, unspecified: Secondary | ICD-10-CM | POA: Diagnosis not present

## 2020-01-05 DIAGNOSIS — Z975 Presence of (intrauterine) contraceptive device: Secondary | ICD-10-CM | POA: Insufficient documentation

## 2020-01-05 DIAGNOSIS — Z888 Allergy status to other drugs, medicaments and biological substances status: Secondary | ICD-10-CM | POA: Insufficient documentation

## 2020-01-05 DIAGNOSIS — R339 Retention of urine, unspecified: Secondary | ICD-10-CM | POA: Diagnosis present

## 2020-01-05 DIAGNOSIS — Z882 Allergy status to sulfonamides status: Secondary | ICD-10-CM | POA: Diagnosis not present

## 2020-01-05 DIAGNOSIS — Z885 Allergy status to narcotic agent status: Secondary | ICD-10-CM | POA: Insufficient documentation

## 2020-01-05 DIAGNOSIS — Z79899 Other long term (current) drug therapy: Secondary | ICD-10-CM | POA: Diagnosis not present

## 2020-01-05 DIAGNOSIS — Z91040 Latex allergy status: Secondary | ICD-10-CM | POA: Insufficient documentation

## 2020-01-05 DIAGNOSIS — Z880 Allergy status to penicillin: Secondary | ICD-10-CM | POA: Insufficient documentation

## 2020-01-05 DIAGNOSIS — B9689 Other specified bacterial agents as the cause of diseases classified elsewhere: Secondary | ICD-10-CM

## 2020-01-05 DIAGNOSIS — N730 Acute parametritis and pelvic cellulitis: Secondary | ICD-10-CM

## 2020-01-05 HISTORY — DX: Herpesviral infection, unspecified: B00.9

## 2020-01-05 LAB — URINALYSIS, ROUTINE W REFLEX MICROSCOPIC
Bilirubin Urine: NEGATIVE
Glucose, UA: NEGATIVE mg/dL
Hgb urine dipstick: NEGATIVE
Ketones, ur: NEGATIVE mg/dL
Leukocytes,Ua: NEGATIVE
Nitrite: NEGATIVE
Protein, ur: NEGATIVE mg/dL
Specific Gravity, Urine: 1.015 (ref 1.005–1.030)
pH: 6 (ref 5.0–8.0)

## 2020-01-05 LAB — WET PREP, GENITAL
Sperm: NONE SEEN
Trich, Wet Prep: NONE SEEN
Yeast Wet Prep HPF POC: NONE SEEN

## 2020-01-05 LAB — PREGNANCY, URINE: Preg Test, Ur: NEGATIVE

## 2020-01-05 MED ORDER — ONDANSETRON 4 MG PO TBDP: 4.0000 mg | ORAL_TABLET | Freq: Three times a day (TID) | ORAL | 0 refills | Status: DC | PRN

## 2020-01-05 MED ORDER — CEFTRIAXONE SODIUM 500 MG IJ SOLR
500.0000 mg | Freq: Once | INTRAMUSCULAR | Status: AC
  Administered 2020-01-05: 500 mg via INTRAMUSCULAR
  Filled 2020-01-05: qty 500

## 2020-01-05 MED ORDER — METRONIDAZOLE 500 MG PO TABS: 500.0000 mg | ORAL_TABLET | Freq: Two times a day (BID) | ORAL | 0 refills | Status: AC

## 2020-01-05 MED ORDER — DOXYCYCLINE HYCLATE 100 MG PO TABS
100.0000 mg | ORAL_TABLET | Freq: Once | ORAL | Status: AC
  Administered 2020-01-05: 14:00:00 100 mg via ORAL
  Filled 2020-01-05: qty 1

## 2020-01-05 MED ORDER — DOXYCYCLINE HYCLATE 100 MG PO CAPS: 100.0000 mg | ORAL_CAPSULE | Freq: Two times a day (BID) | ORAL | 0 refills | Status: AC

## 2020-01-05 MED ORDER — METRONIDAZOLE 500 MG PO TABS
500.0000 mg | ORAL_TABLET | Freq: Once | ORAL | Status: AC
  Administered 2020-01-05: 500 mg via ORAL
  Filled 2020-01-05: qty 1

## 2020-01-05 MED ORDER — LIDOCAINE HCL (PF) 1 % IJ SOLN
INTRAMUSCULAR | Status: AC
  Filled 2020-01-05: qty 5

## 2020-01-05 MED ORDER — DIPHENHYDRAMINE HCL 25 MG PO CAPS
25.0000 mg | ORAL_CAPSULE | Freq: Once | ORAL | Status: AC
  Administered 2020-01-05: 25 mg via ORAL
  Filled 2020-01-05: qty 1

## 2020-01-05 NOTE — ED Provider Notes (Signed)
[facility] EMERGENCY DEPARTMENT Provider Note   CSN: [id number] Arrival date & time: 01/05/20  1108     History Chief Complaint  Patient presents with  . Urinary Retention    Whitney Parker is a 24 y.o. female.  HPI Patient is a 24 year old female with a past medical history significant for recent HSV 1 month ago.  General herpes, asthma, migraines, ovarian cysts.   Patient states that she was recently diagnosed with HSV 1.  Patient states that she was diagnosed with urinary tract infection her 4/8 as well as diagnosed with genital herpes.  She states that she has been sexually active with partner although she states that she uses barrier protection as well as has an IUD in place.  She states that she suspects that she received herpes symplex 1 infection from her partner who has a cold sore on his nose. She states she is prone to BV but has never had PID or STI diagnosed in the past.  Patient states that she has been sexually active for several years.  Patient states that events yesterday morning she woke up and stated that she was unable to urinate.  Her mother-she was a nurse-he is in and out catheter and obtained approximately 600 mL of urine.  Mother has cath patient twice since that time with substantial urine output.  Patient states that she continues to have urinary retention and has abdominal pain that progressively worsens until she is catheterized.  Patient states small amount of normal "discharge for her.  Denies any vaginal pain or bleeding.  States she has not her period currently.  She denies any nausea, vomiting, diarrhea, constipation.  She has a chest pain, shortness of breath, headache or dizziness.  She has a history of ovarian cyst in the past however she states that she was told that resolved on follow-up ultrasound.  I reviewed side effects of Valtrex which include urinary urgency but no other urinary issues.     Past Medical History:  Diagnosis  Date  . Anemia   . Asthma   . Depression   . Herpes   . Migraines   . Ovarian cyst     There are no problems to display for this patient.   Past Surgical History:  Procedure Laterality Date  . LAPAROSCOPIC OVARIAN CYSTECTOMY    . TONSILLECTOMY       OB History   No obstetric history on file.     No family history on file.  Social History   Tobacco Use  . Smoking status: Never Smoker  . Smokeless tobacco: Never Used  Substance Use Topics  . Alcohol use: Yes  . Drug use: No    Home Medications Prior to Admission medications   Medication Sig Start Date End Date Taking? Authorizing Provider  doxycycline (VIBRAMYCIN) 100 MG capsule Take 1 capsule (100 mg total) by mouth 2 (two) times daily for 14 days. 01/05/20 01/19/20  Gailen Shelter, PA  metoCLOPramide (REGLAN) 10 MG tablet Take 1 tablet (10 mg total) every 6 (six) hours as needed by mouth for nausea (nausea/headache). Patient not taking: Reported on 01/28/2018 08/12/17   Palumbo, April, MD  metroNIDAZOLE (FLAGYL) 500 MG tablet Take 1 tablet (500 mg total) by mouth 2 (two) times daily for 14 days. 01/05/20 01/19/20  Gailen Shelter, PA  montelukast (SINGULAIR) 10 MG tablet Take 10 mg by mouth at bedtime.    [provider]  norethindrone (MICRONOR,CAMILA,ERRIN) 0.35 MG tablet Take 1 tablet by  mouth daily.    [provider]  ondansetron (ZOFRAN ODT) 4 MG disintegrating tablet Take 1 tablet (4 mg total) by mouth every 8 (eight) hours as needed for nausea or vomiting. 01/05/20   Gailen Shelter, PA  PARoxetine (PAXIL) 20 MG tablet Take 20 mg by mouth daily.    [provider]  tiZANidine (ZANAFLEX) 4 MG tablet Take 4-8 mg by mouth at bedtime. 06/01/17   [provider]  zonisamide (ZONEGRAN) 100 MG capsule Take 500 mg by mouth at bedtime. 12/20/17   [provider]    Allergies    Compazine [prochlorperazine edisylate], Latex, Other, Penicillins, Percocet [oxycodone-acetaminophen],  Sulfa antibiotics, Tape, and Topamax [topiramate]  Review of Systems   Review of Systems  Constitutional: Negative for chills and fever.  HENT: Negative for congestion.   Eyes: Negative for pain.  Respiratory: Negative for cough and shortness of breath.   Cardiovascular: Negative for chest pain and leg swelling.  Gastrointestinal: Positive for abdominal pain. Negative for diarrhea, nausea and vomiting.  Genitourinary: Positive for difficulty urinating and vaginal discharge. Negative for flank pain, frequency, hematuria, menstrual problem, urgency and vaginal bleeding.  Musculoskeletal: Negative for myalgias.  Skin: Negative for rash.  Neurological: Negative for dizziness and headaches.    Physical Exam Updated Vital Signs BP 128/89 (BP Location: Right Arm)   Pulse 66   Temp 98.2 F (36.8 C) (Oral)   Resp 18   Ht 5\' 6"  (1.676 m)   Wt 71.2 kg   SpO2 100%   BMI 25.34 kg/m   Physical Exam Vitals and nursing note reviewed. Exam conducted with a chaperone present.  Constitutional:      General: She is not in acute distress. HENT:     Head: Normocephalic and atraumatic.     Nose: Nose normal.     Mouth/Throat:     Mouth: Mucous membranes are moist.  Eyes:     General: No scleral icterus. Cardiovascular:     Rate and Rhythm: Normal rate and regular rhythm.     Pulses: Normal pulses.     Heart sounds: Normal heart sounds.  Pulmonary:     Effort: Pulmonary effort is normal. No respiratory distress.     Breath sounds: No wheezing.  Abdominal:     Palpations: Abdomen is soft.     Tenderness: There is no abdominal tenderness. There is no right CVA tenderness, left CVA tenderness, guarding or rebound.  Genitourinary:    Comments: Vesicular/herpetic rash to the left external labia.  Small vesicular patch in the perineal region just anterior to the anus. Vaginal canal without abnormal lesions, discharge is brown Cervix appears normal, is closed with 2 strings from IUD No adnexal  tenderness.  There is cervical motion tenderness. Musculoskeletal:     Cervical back: Normal range of motion.     Right lower leg: No edema.     Left lower leg: No edema.  Skin:    General: Skin is warm and dry.     Capillary Refill: Capillary refill takes less than 2 seconds.  Neurological:     Mental Status: She is alert. Mental status is at baseline.  Psychiatric:        Mood and Affect: Mood normal.        Behavior: Behavior normal.     ED Results / Procedures / Treatments   Labs (all labs ordered are listed, but only abnormal results are displayed) Labs Reviewed  WET PREP, GENITAL - Abnormal; Notable for the  following components:      Result Value   Clue Cells Wet Prep HPF POC PRESENT (*)    WBC, Wet Prep HPF POC MANY (*)    All other components within normal limits  URINE CULTURE  URINALYSIS, ROUTINE W REFLEX MICROSCOPIC  PREGNANCY, URINE  GC/CHLAMYDIA PROBE AMP () NOT AT [facility]    EKG None  Radiology No results found.  Procedures Ultrasound ED Abd  Date/Time: 01/06/2020 12:04 AM Performed by: Tedd Sias, PA Authorized by: Tedd Sias, PA   Procedure details:    Indications: decreased urinary output     Scope of abdominal ultrasound: Distended bladder.   Bladder:  Visualized        Bladder findings:    Free pelvic fluid: not identified     Volume:  650   (including critical care time)  Medications Ordered in ED Medications  cefTRIAXone (ROCEPHIN) injection 500 mg (500 mg Intramuscular Given 01/05/20 1351)  doxycycline (VIBRA-TABS) tablet 100 mg (100 mg Oral Given 01/05/20 1349)  metroNIDAZOLE (FLAGYL) tablet 500 mg (500 mg Oral Given 01/05/20 1349)  diphenhydrAMINE (BENADRYL) capsule 25 mg (25 mg Oral Given 01/05/20 1349)    ED Course  I have reviewed the triage vital signs and the nursing notes.  Pertinent labs & imaging results that were available during my care of the patient were reviewed by me and considered in my medical  decision making (see chart for details).  Patient is 24 year old female presented today with chief complaint of urinary retention.  She is currently being treated for UTI however on exam she has CMT and I have concern for PID.  She has recently been diagnosed with genital herpes.  She is sexually active and says that she use barrier protectiom however she does have issues of been in the past.  Concern for non-STI question of PID or perhaps see original PID.  Will treat for PID.  Bladder decompressed after ultrasonography showed at least 600 mL of urine in bladder.  Patient has been significant relief with this.  No specific emergency of urinary retention found.  On my review of the literature and found herpes simplex as a very rare cause of urinary retention in young patients.  Her medications would likely cause this.  She is not taking anything.  I gave patient 1 small dose of Benadryl she does experience some itching with penicillins I am providing her with Rocephin as part of her "treatment.  She has Foley in place I will suspicion that this will worsen her urinary retention.  Urinalysis is benign with no acute abnormalities.  No evidence of infection.  Urine culture pending.  Pregnancy test negative.  Wet prep shows many WBCs and clue cells present.  GC chlamydia probe pending.  Will empirically treat patient for PID as she does have CMT.  Patient will follow up with urology and Foley in place.  Patient given Rocephin IM 500 mg plus Doxy and Flagyl.  Discharged with 2 weeks of Flagyl and doxycycline.  She was given 25 mg of Benadryl as she sometimes has itching with penicillin.   Discharge patient with Foley in place and antibiotics prescribed.  She will follow up with urology.  Return precautions given.  I discussed this case with my attending physician who cosigned this note including patient's presenting symptoms, physical exam, and planned diagnostics and interventions. Attending physician stated  agreement with plan or made changes to plan which were implemented.    MDM Rules/Calculators/A&P  The medical records were personally reviewed by myself. I personally reviewed all lab results and interpreted all imaging studies and either concurred with their official read or contacted radiology for clarification. Additional history obtained from old records and family members.  This patient appears reasonably screened and I doubt any other medical condition requiring further workup, evaluation, or treatment in the ED at this time prior to discharge.   Patient's vitals are WNL apart from vital sign abnormalities discussed above, patient is in NAD, and able to ambulate in the ED at their baseline and able to tolerate PO.  Pain has been managed or a plan has been made for home management and has no complaints prior to discharge. Patient is comfortable with above plan and for discharge at this time. All questions were answered prior to disposition. Results from the ER workup discussed with the patient face to face and all questions answered to the best of my ability. The patient is safe for discharge with strict return precautions. Patient appears safe for discharge with appropriate follow-up. Conveyed my impression with the patient and they voiced understanding and are agreeable to plan.   An After Visit Summary was printed and given to the patient.  Portions of this note were generated with Scientist, clinical (histocompatibility and immunogenetics). Dictation errors may occur despite best attempts at proofreading.    Final Clinical Impression(s) / ED Diagnoses Final diagnoses:  PID (acute pelvic inflammatory disease)  BV (bacterial vaginosis)  Urinary retention    Rx / DC Orders ED Discharge Orders         Ordered    doxycycline (VIBRAMYCIN) 100 MG capsule  2 times daily     01/05/20 1349    metroNIDAZOLE (FLAGYL) 500 MG tablet  2 times daily     01/05/20 1349    ondansetron (ZOFRAN ODT) 4 MG  disintegrating tablet  Every 8 hours PRN     01/05/20 1350           Gailen Shelter, [place] 01/06/20 0009    Melene Plan, DO 01/06/20 [phone]

## 2020-01-05 NOTE — ED Triage Notes (Addendum)
States she has not been able to urinate since yesterday morning. Her mother is a Engineer, civil (consulting) and states she did an in and out cath with some supplies she got from work. Currently being treated for UTI.

## 2020-01-05 NOTE — Discharge Instructions (Addendum)
Please call tomorrow morning to make an appointment with Elio Forget of urology.  Please keep Foley in place for ease of urinating.  Please drink plenty water and eat as you normally would.  Please return to ED for any new or concerning symptoms.  Scribed you some Zofran in case you experience any nausea which may accompany taking these antibiotics.  Please take antibiotics as prescribed.  You are being treated for PID.  Pelvic inflammatory disease is a serious infection and can cause loss of fertility if not treated correctly. The antibiotic treatment is 14 days (2 weeks).   You are being tested for gonorrhea chlamydia today.  Please wait 2 weeks AND be sure that you and your partners are symptom free before returning to sexual activity. Please use protection with every sexual encounter.

## 2020-01-06 LAB — GC/CHLAMYDIA PROBE AMP (~~LOC~~) NOT AT ARMC
Chlamydia: NEGATIVE
Comment: NEGATIVE
Comment: NORMAL
Neisseria Gonorrhea: NEGATIVE

## 2020-01-06 LAB — URINE CULTURE: Culture: NO GROWTH

## 2020-10-29 ENCOUNTER — Encounter (HOSPITAL_BASED_OUTPATIENT_CLINIC_OR_DEPARTMENT_OTHER): Payer: Self-pay | Admitting: *Deleted

## 2020-10-29 ENCOUNTER — Other Ambulatory Visit: Payer: Self-pay

## 2020-10-29 ENCOUNTER — Emergency Department (HOSPITAL_BASED_OUTPATIENT_CLINIC_OR_DEPARTMENT_OTHER)
Admission: EM | Admit: 2020-10-29 | Discharge: 2020-10-29 | Disposition: A | Discharge status: Home / Self Care | Payer: BC Managed Care – PPO | Attending: Emergency Medicine | Admitting: Emergency Medicine

## 2020-10-29 ENCOUNTER — Emergency Department (HOSPITAL_BASED_OUTPATIENT_CLINIC_OR_DEPARTMENT_OTHER): Payer: BC Managed Care – PPO

## 2020-10-29 DIAGNOSIS — J45909 Unspecified asthma, uncomplicated: Secondary | ICD-10-CM | POA: Insufficient documentation

## 2020-10-29 DIAGNOSIS — Z91040 Latex allergy status: Secondary | ICD-10-CM | POA: Insufficient documentation

## 2020-10-29 DIAGNOSIS — N12 Tubulo-interstitial nephritis, not specified as acute or chronic: Secondary | ICD-10-CM

## 2020-10-29 DIAGNOSIS — R000 Tachycardia, unspecified: Secondary | ICD-10-CM | POA: Insufficient documentation

## 2020-10-29 DIAGNOSIS — M545 Low back pain, unspecified: Secondary | ICD-10-CM | POA: Diagnosis present

## 2020-10-29 DIAGNOSIS — R109 Unspecified abdominal pain: Secondary | ICD-10-CM | POA: Diagnosis not present

## 2020-10-29 LAB — COMPREHENSIVE METABOLIC PANEL
ALT: 18 U/L (ref 0–44)
AST: 26 U/L (ref 15–41)
Albumin: 4.9 g/dL (ref 3.5–5.0)
Alkaline Phosphatase: 69 U/L (ref 38–126)
Anion gap: 10 (ref 5–15)
BUN: 10 mg/dL (ref 6–20)
CO2: 21 mmol/L — ABNORMAL LOW (ref 22–32)
Calcium: 9.4 mg/dL (ref 8.9–10.3)
Chloride: 106 mmol/L (ref 98–111)
Creatinine, Ser: 0.84 mg/dL (ref 0.44–1.00)
GFR, Estimated: >60 mL/min (ref >60)
Glucose, Bld: 95 mg/dL (ref 70–99)
Potassium: 3.9 mmol/L (ref 3.5–5.1)
Sodium: 137 mmol/L (ref 135–145)
Total Bilirubin: 0.5 mg/dL (ref 0.3–1.2)
Total Protein: 7.5 g/dL (ref 6.5–8.1)

## 2020-10-29 LAB — URINALYSIS, ROUTINE W REFLEX MICROSCOPIC
Bilirubin Urine: NEGATIVE
Glucose, UA: NEGATIVE mg/dL
Ketones, ur: NEGATIVE mg/dL
Nitrite: NEGATIVE
Protein, ur: NEGATIVE mg/dL
Specific Gravity, Urine: 1.02 (ref 1.005–1.030)
pH: 6 (ref 5.0–8.0)

## 2020-10-29 LAB — CBC WITH DIFFERENTIAL/PLATELET
Abs Immature Granulocytes: 0.02 10*3/uL (ref 0.00–0.07)
Basophils Absolute: 0.1 10*3/uL (ref 0.0–0.1)
Basophils Relative: 1 %
Eosinophils Absolute: 0.8 10*3/uL — ABNORMAL HIGH (ref 0.0–0.5)
Eosinophils Relative: 9 %
HCT: 39.2 % (ref 36.0–46.0)
Hemoglobin: 12.3 g/dL (ref 12.0–15.0)
Immature Granulocytes: 0 %
Lymphocytes Relative: 29 %
Lymphs Abs: 2.4 10*3/uL (ref 0.7–4.0)
MCH: 20.2 pg — ABNORMAL LOW (ref 26.0–34.0)
MCHC: 31.4 g/dL (ref 30.0–36.0)
MCV: 64.4 fL — ABNORMAL LOW (ref 80.0–100.0)
Monocytes Absolute: 0.5 10*3/uL (ref 0.1–1.0)
Monocytes Relative: 6 %
Neutro Abs: 4.6 10*3/uL (ref 1.7–7.7)
Neutrophils Relative %: 55 %
Platelets: 386 10*3/uL (ref 150–400)
RBC: 6.09 MIL/uL — ABNORMAL HIGH (ref 3.87–5.11)
RDW: 17.5 % — ABNORMAL HIGH (ref 11.5–15.5)
WBC: 8.3 10*3/uL (ref 4.0–10.5)
nRBC: 0 % (ref 0.0–0.2)

## 2020-10-29 LAB — PREGNANCY, URINE: Preg Test, Ur: NEGATIVE

## 2020-10-29 LAB — URINALYSIS, MICROSCOPIC (REFLEX)
RBC / HPF: >50 RBC/hpf (ref 0–5)
WBC, UA: >50 WBC/hpf (ref 0–5)

## 2020-10-29 LAB — LIPASE, BLOOD: Lipase: 39 U/L (ref 11–51)

## 2020-10-29 MED ORDER — FENTANYL CITRATE (PF) 100 MCG/2ML IJ SOLN
50.0000 ug | INTRAMUSCULAR | Status: DC | PRN
Start: 2020-10-29 — End: 2020-10-29
  Administered 2020-10-29: 50 ug via INTRAVENOUS
  Filled 2020-10-29: qty 2

## 2020-10-29 MED ORDER — ONDANSETRON HCL 4 MG/2ML IJ SOLN
4.0000 mg | Freq: Once | INTRAMUSCULAR | Status: AC
  Administered 2020-10-29: 4 mg via INTRAVENOUS
  Filled 2020-10-29: qty 2

## 2020-10-29 MED ORDER — SODIUM CHLORIDE 0.9 % IV BOLUS
1000.0000 mL | Freq: Once | INTRAVENOUS | Status: AC
  Administered 2020-10-29: 1000 mL via INTRAVENOUS

## 2020-10-29 MED ORDER — CEPHALEXIN 500 MG PO CAPS: 1000.0000 mg | ORAL_CAPSULE | Freq: Two times a day (BID) | ORAL | 0 refills | Status: AC

## 2020-10-29 MED ORDER — KETOROLAC TROMETHAMINE 30 MG/ML IJ SOLN
30.0000 mg | Freq: Once | INTRAMUSCULAR | Status: AC
  Administered 2020-10-29: 30 mg via INTRAVENOUS
  Filled 2020-10-29: qty 1

## 2020-10-29 MED ORDER — ONDANSETRON 4 MG PO TBDP: 4.0000 mg | ORAL_TABLET | Freq: Three times a day (TID) | ORAL | 0 refills | Status: AC | PRN

## 2020-10-29 MED ORDER — SODIUM CHLORIDE 0.9 % IV SOLN
1.0000 g | Freq: Once | INTRAVENOUS | Status: AC
  Administered 2020-10-29: 1 g via INTRAVENOUS
  Filled 2020-10-29: qty 10

## 2020-10-29 NOTE — ED Triage Notes (Signed)
Lower back pain for a month. [facility]. Dysuria. Urinary frequency.  Hx of frequent UTI's.

## 2020-10-29 NOTE — Discharge Instructions (Addendum)
Please follow-up with your primary care doctor.  As we discussed, you most likely have a infection of the kidney called pyelonephritis.  Please use the Zofran I have prescribed you for nausea.  Please take the Keflex I prescribed you until you run out of tablets.  Is important that you take this medication even if your symptoms seem to improve.  Please use Tylenol or ibuprofen for pain.  You may use 600 mg ibuprofen every 6 hours or 1000 mg of Tylenol every 6 hours.  You may choose to alternate between the 2.  This would be most effective.  Not to exceed 4 g of Tylenol within 24 hours.  Not to exceed 3200 mg ibuprofen 24 hours.

## 2020-10-29 NOTE — ED Notes (Signed)
Pt given blankets, eye  Covers and pillow for comfort

## 2020-10-29 NOTE — ED Provider Notes (Addendum)
[facility] EMERGENCY DEPARTMENT Provider Note   CSN: [id number] Arrival date & time: 10/29/20  1508     History Chief Complaint  Patient presents with  . Back Pain    Whitney Parker is a 25 y.o. female.  HPI Patient is a 25 year old female past medical history significant for frequent UTIs, asthma, anemia, depression, migraines  Patient states that she is developed symptoms of UTI which include burning with urination and urinary frequency.  She states her symptoms started on Monday have gotten progressively worse.  She states that she started having some fevers and chills nausea and malaise over the past 24 hours.  She denies any vomiting.  She says she has not measured her temperature at home.  She denies any headaches lightheadedness, cough, congestion.   She states that she is not taking any antibiotics for this or any other symptoms.  She has taken some Tylenol however.  She denies any other associate symptoms.  No vaginal discharge, dyspareunia, vaginal bleeding.  She states that she has no concern for sexually transmitted infections and is not interested in being tested for this today.  Patient states that she has had low back pain and issues with muscle spasm for the past month and she has been seeing a [facility].  No aggravating or mitigating factors.  She is taken no medications other than Tylenol prior to arrival.    Past Medical History:  Diagnosis Date  . Anemia   . Asthma   . Depression   . Herpes   . Migraines   . Ovarian cyst     There are no problems to display for this patient.   Past Surgical History:  Procedure Laterality Date  . LAPAROSCOPIC OVARIAN CYSTECTOMY    . TONSILLECTOMY       OB History   No obstetric history on file.     No family history on file.  Social History   Tobacco Use  . Smoking status: Never Smoker  . Smokeless tobacco: Never Used  Vaping Use  . Vaping Use: Never used  Substance Use Topics  . Alcohol  use: Yes  . Drug use: No    Home Medications Prior to Admission medications   Medication Sig Start Date End Date Taking? Authorizing Provider  cephALEXin (KEFLEX) 500 MG capsule Take 2 capsules (1,000 mg total) by mouth 2 (two) times daily for 14 days. 10/29/20 11/12/20 Yes Lorean Ekstrand S, PA  montelukast (SINGULAIR) 10 MG tablet Take 10 mg by mouth at bedtime.   Yes [provider]  ondansetron (ZOFRAN ODT) 4 MG disintegrating tablet Take 1 tablet (4 mg total) by mouth every 8 (eight) hours as needed for nausea or vomiting. 10/29/20  Yes Konnor Jorden S, PA  PARoxetine (PAXIL) 20 MG tablet Take 20 mg by mouth daily.   Yes [provider]  tiZANidine (ZANAFLEX) 4 MG tablet Take 4-8 mg by mouth at bedtime. 06/01/17  Yes [provider]  valACYclovir (VALTREX) 500 MG tablet Take by mouth. 02/17/20  Yes [provider]  zonisamide (ZONEGRAN) 100 MG capsule Take 500 mg by mouth at bedtime. 12/20/17  Yes [provider]  metoCLOPramide (REGLAN) 10 MG tablet Take 1 tablet (10 mg total) every 6 (six) hours as needed by mouth for nausea (nausea/headache). Patient not taking: No sig reported 08/12/17   Palumbo, April, MD  norethindrone (MICRONOR,CAMILA,ERRIN) 0.35 MG tablet Take 1 tablet by mouth daily.    [provider]    Allergies  Compazine [prochlorperazine edisylate], Latex, Other, Penicillins, Percocet [oxycodone-acetaminophen], Sulfa antibiotics, Tape, and Topamax [topiramate]  Review of Systems   Review of Systems  Constitutional: Positive for fatigue and fever. Negative for chills.  HENT: Negative for congestion.   Eyes: Negative for pain.  Respiratory: Negative for cough and shortness of breath.   Cardiovascular: Negative for chest pain and leg swelling.  Gastrointestinal: Positive for abdominal pain and nausea. Negative for vomiting.  Genitourinary: Positive for dysuria, frequency and urgency. Negative for dyspareunia.   Musculoskeletal: Positive for back pain. Negative for myalgias.  Skin: Negative for rash.  Neurological: Negative for dizziness and headaches.    Physical Exam Updated Vital Signs BP 116/70 (BP Location: Right Arm)   Pulse 70   Temp 98.1 F (36.7 C) (Oral)   Resp 16   Ht 5\' 6"  (1.676 m)   Wt 65.8 kg   SpO2 100%   BMI 23.40 kg/m   Physical Exam Vitals and nursing note reviewed.  Constitutional:      General: She is not in acute distress.    Comments: Acutely uncomfortable appearing nontoxic pleasant female 25 years old appears stated age.  HENT:     Head: Normocephalic and atraumatic.     Nose: Nose normal.     Mouth/Throat:     Mouth: Mucous membranes are dry.  Eyes:     General: No scleral icterus. Cardiovascular:     Rate and Rhythm: Regular rhythm. Tachycardia present.     Pulses: Normal pulses.     Heart sounds: Normal heart sounds.  Pulmonary:     Effort: Pulmonary effort is normal. No respiratory distress.     Breath sounds: No wheezing.  Abdominal:     Palpations: Abdomen is soft.     Tenderness: There is abdominal tenderness (Suprapubic tenderness that is mild). There is left CVA tenderness. There is no right CVA tenderness, guarding or rebound.  Musculoskeletal:     Cervical back: Normal range of motion.     Right lower leg: No edema.     Left lower leg: No edema.  Skin:    General: Skin is warm and dry.     Capillary Refill: Capillary refill takes less than 2 seconds.  Neurological:     Mental Status: She is alert. Mental status is at baseline.  Psychiatric:        Mood and Affect: Mood normal.        Behavior: Behavior normal.     ED Results / Procedures / Treatments   Labs (all labs ordered are listed, but only abnormal results are displayed) Labs Reviewed  URINALYSIS, ROUTINE W REFLEX MICROSCOPIC - Abnormal; Notable for the following components:      Result Value   APPearance TURBID (*)    Hgb urine dipstick LARGE (*)    Leukocytes,Ua  MODERATE (*)    All other components within normal limits  URINALYSIS, MICROSCOPIC (REFLEX) - Abnormal; Notable for the following components:   Bacteria, UA MANY (*)    All other components within normal limits  COMPREHENSIVE METABOLIC PANEL - Abnormal; Notable for the following components:   CO2 21 (*)    All other components within normal limits  CBC WITH DIFFERENTIAL/PLATELET - Abnormal; Notable for the following components:   RBC 6.09 (*)    MCV 64.4 (*)    MCH 20.2 (*)    RDW 17.5 (*)    Eosinophils Absolute 0.8 (*)    All other components within normal limits  URINE CULTURE  PREGNANCY, URINE  LIPASE, BLOOD    EKG None  Radiology CT Renal Stone Study  Result Date: 10/29/2020 CLINICAL DATA:  Flank pain renal stones suspected, clinically patient has pyelonephritis. Right-sided flank pain, dysuria, frequent UTIs. 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 EXAM: CT ABDOMEN AND PELVIS WITHOUT CONTRAST TECHNIQUE: Multidetector CT imaging of the abdomen and pelvis was performed following the standard protocol without IV contrast. COMPARISON:  CT renal stone protocol Jan 28, 2018 and CT abdomen pelvis November 02, 2018. FINDINGS: Lower chest: No acute abnormality. Hepatobiliary: Unremarkable noncontrast appearance of the hepatic parenchyma. Gallbladder is unremarkable. No biliary ductal dilatation. Pancreas: Unremarkable Spleen: Unremarkable Adrenals/Urinary Tract: Adrenal glands are unremarkable. Tiny left-sided nonobstructive 1-2 mm renal stones. No hydronephrosis. Bladder is unremarkable. Stomach/Bowel: Stomach and small bowel are unremarkable. Appendix is not definitely visualized however there is no inflammatory stranding in the right lower quadrant to suggest acute inflammation. Small volume of formed stool throughout the colon. Vascular/Lymphatic: No significant vascular findings are present. No enlarged abdominal or pelvic lymph nodes. Reproductive: IUD in place, otherwise unremarkable. Other: No  abdominopelvic ascites. Musculoskeletal: No acute or significant osseous findings. IMPRESSION: Tiny nonobstructive left renal stones. No hydronephrosis or nephroureterolithiasis. Electronically Signed   By: Maudry Mayhew MD   On: 10/29/2020 17:23    Procedures Procedures   Medications Ordered in ED Medications  fentaNYL (SUBLIMAZE) injection 50 mcg (50 mcg Intravenous Given 10/29/20 1736)  sodium chloride 0.9 % bolus 1,000 mL (0 mLs Intravenous Stopped 10/29/20 1827)  ondansetron (ZOFRAN) injection 4 mg (4 mg Intravenous Given 10/29/20 1736)  cefTRIAXone (ROCEPHIN) 1 g in sodium chloride 0.9 % 100 mL IVPB (0 g Intravenous Stopped 10/29/20 1827)  ketorolac (TORADOL) 30 MG/ML injection 30 mg (30 mg Intravenous Given 10/29/20 1822)    ED Course  I have reviewed the triage vital signs and the nursing notes.  Pertinent labs & imaging results that were available during my care of the patient were reviewed by me and considered in my medical decision making (see chart for details).  Patient is 25 year old female with history of frequent UTIs here today with frequency urgency dysuria her symptoms have been ongoing since Monday.  About significantly worse today.  She has not taken antibiotics.  She has nausea without vomiting and on physical exam she has left CVA tenderness some suprapubic tenderness that is very mild.  Patient declined pelvic exam which I think is reasonable given that her symptoms to be very consistent with pyelonephritis.  She does understand however that I could not diagnose or exclude PID without pelvic exam.  Clinical Course as of 10/29/20 1925  Thu Oct 29, 2020  1744 CT renal study negative for ureterolith [WF]    Clinical Course User Index [WF] Gailen Shelter, [place]   MDM Rules/Calculators/A&P                          CBC without leukocytosis there is hemoconcentration likely due to dehydration.  CMP unremarkable.  Urinalysis consistent with infection with turbid urine with large  hemoglobin moderate leukocytes and many bacteria.  Suggest is negative.  Lipase within normal with no pancreatitis.  Urine culture pending.  Patient is severely improved after analgesia and Rocephin and 1 L normal saline.  She will follow-up with her primary care doctor discharged with Keflex and Zofran.  Tylenol and ibuprofen recommendations were given.  Return precautions given as well.  Final Clinical Impression(s) / ED Diagnoses Final diagnoses:  Pyelonephritis  Rx / DC Orders ED Discharge Orders         Ordered    cephALEXin (KEFLEX) 500 MG capsule  2 times daily        10/29/20 1806    ondansetron (ZOFRAN ODT) 4 MG disintegrating tablet  Every 8 hours PRN        10/29/20 1806           Gailen Shelter, [place] 10/29/20 1926    Rozelle Logan, DO 10/30/20 0020  Addendum for the completion of HPI 10:08 PM 11/01/20    Gailen Shelter, PA 11/01/20 2208    Horton, Clabe Seal, DO 11/09/20 [phone]

## 2020-10-31 LAB — URINE CULTURE

## 2020-11-17 ENCOUNTER — Emergency Department (HOSPITAL_BASED_OUTPATIENT_CLINIC_OR_DEPARTMENT_OTHER)
Admission: EM | Admit: 2020-11-17 | Discharge: 2020-11-17 | Disposition: A | Discharge status: Home / Self Care | Payer: BC Managed Care – PPO | Attending: Emergency Medicine | Admitting: Emergency Medicine

## 2020-11-17 ENCOUNTER — Encounter (HOSPITAL_BASED_OUTPATIENT_CLINIC_OR_DEPARTMENT_OTHER): Payer: Self-pay

## 2020-11-17 ENCOUNTER — Other Ambulatory Visit: Payer: Self-pay

## 2020-11-17 ENCOUNTER — Emergency Department (HOSPITAL_BASED_OUTPATIENT_CLINIC_OR_DEPARTMENT_OTHER): Payer: BC Managed Care – PPO

## 2020-11-17 DIAGNOSIS — N3000 Acute cystitis without hematuria: Secondary | ICD-10-CM

## 2020-11-17 DIAGNOSIS — J45909 Unspecified asthma, uncomplicated: Secondary | ICD-10-CM | POA: Insufficient documentation

## 2020-11-17 DIAGNOSIS — R109 Unspecified abdominal pain: Secondary | ICD-10-CM | POA: Diagnosis present

## 2020-11-17 DIAGNOSIS — N83291 Other ovarian cyst, right side: Secondary | ICD-10-CM | POA: Insufficient documentation

## 2020-11-17 DIAGNOSIS — R102 Pelvic and perineal pain: Secondary | ICD-10-CM

## 2020-11-17 DIAGNOSIS — N83209 Unspecified ovarian cyst, unspecified side: Secondary | ICD-10-CM

## 2020-11-17 DIAGNOSIS — Z91010 Allergy to peanuts: Secondary | ICD-10-CM | POA: Diagnosis not present

## 2020-11-17 DIAGNOSIS — Z91040 Latex allergy status: Secondary | ICD-10-CM | POA: Insufficient documentation

## 2020-11-17 LAB — BASIC METABOLIC PANEL
Anion gap: 9 (ref 5–15)
BUN: 11 mg/dL (ref 6–20)
CO2: 20 mmol/L — ABNORMAL LOW (ref 22–32)
Calcium: 8.9 mg/dL (ref 8.9–10.3)
Chloride: 108 mmol/L (ref 98–111)
Creatinine, Ser: 0.74 mg/dL (ref 0.44–1.00)
GFR, Estimated: >60 mL/min (ref >60)
Glucose, Bld: 91 mg/dL (ref 70–99)
Potassium: 3.3 mmol/L — ABNORMAL LOW (ref 3.5–5.1)
Sodium: 137 mmol/L (ref 135–145)

## 2020-11-17 LAB — CBC WITH DIFFERENTIAL/PLATELET
Abs Immature Granulocytes: 0.03 10*3/uL (ref 0.00–0.07)
Basophils Absolute: 0.1 10*3/uL (ref 0.0–0.1)
Basophils Relative: 1 %
Eosinophils Absolute: 0.9 10*3/uL — ABNORMAL HIGH (ref 0.0–0.5)
Eosinophils Relative: 10 %
HCT: 34.1 % — ABNORMAL LOW (ref 36.0–46.0)
Hemoglobin: 10.9 g/dL — ABNORMAL LOW (ref 12.0–15.0)
Immature Granulocytes: 0 %
Lymphocytes Relative: 31 %
Lymphs Abs: 2.8 10*3/uL (ref 0.7–4.0)
MCH: 20.3 pg — ABNORMAL LOW (ref 26.0–34.0)
MCHC: 32 g/dL (ref 30.0–36.0)
MCV: 63.6 fL — ABNORMAL LOW (ref 80.0–100.0)
Monocytes Absolute: 0.4 10*3/uL (ref 0.1–1.0)
Monocytes Relative: 5 %
Neutro Abs: 4.7 10*3/uL (ref 1.7–7.7)
Neutrophils Relative %: 53 %
Platelets: 343 10*3/uL (ref 150–400)
RBC: 5.36 MIL/uL — ABNORMAL HIGH (ref 3.87–5.11)
RDW: 16.2 % — ABNORMAL HIGH (ref 11.5–15.5)
WBC: 9 10*3/uL (ref 4.0–10.5)
nRBC: 0 % (ref 0.0–0.2)

## 2020-11-17 LAB — URINALYSIS, MICROSCOPIC (REFLEX)

## 2020-11-17 LAB — URINALYSIS, ROUTINE W REFLEX MICROSCOPIC
Bilirubin Urine: NEGATIVE
Glucose, UA: NEGATIVE mg/dL
Hgb urine dipstick: NEGATIVE
Ketones, ur: NEGATIVE mg/dL
Nitrite: NEGATIVE
Protein, ur: NEGATIVE mg/dL
Specific Gravity, Urine: 1.01 (ref 1.005–1.030)
pH: 7 (ref 5.0–8.0)

## 2020-11-17 LAB — WET PREP, GENITAL
Clue Cells Wet Prep HPF POC: NONE SEEN
Sperm: NONE SEEN
Trich, Wet Prep: NONE SEEN
Yeast Wet Prep HPF POC: NONE SEEN

## 2020-11-17 LAB — PREGNANCY, URINE: Preg Test, Ur: NEGATIVE

## 2020-11-17 MED ORDER — MORPHINE SULFATE (PF) 4 MG/ML IV SOLN
4.0000 mg | Freq: Once | INTRAVENOUS | Status: AC
  Administered 2020-11-17: 4 mg via INTRAVENOUS
  Filled 2020-11-17: qty 1

## 2020-11-17 MED ORDER — PROMETHAZINE HCL 25 MG/ML IJ SOLN
6.2500 mg | Freq: Once | INTRAMUSCULAR | Status: AC
  Administered 2020-11-17: 6.25 mg via INTRAVENOUS
  Filled 2020-11-17: qty 1

## 2020-11-17 MED ORDER — FENTANYL CITRATE (PF) 100 MCG/2ML IJ SOLN
50.0000 ug | Freq: Once | INTRAMUSCULAR | Status: AC
  Administered 2020-11-17: 50 ug via INTRAVENOUS
  Filled 2020-11-17: qty 2

## 2020-11-17 MED ORDER — PROMETHAZINE HCL 25 MG/ML IJ SOLN
12.5000 mg | Freq: Once | INTRAMUSCULAR | Status: AC
  Administered 2020-11-17: 12.5 mg via INTRAVENOUS
  Filled 2020-11-17: qty 1

## 2020-11-17 MED ORDER — ONDANSETRON HCL 4 MG/2ML IJ SOLN
4.0000 mg | Freq: Once | INTRAMUSCULAR | Status: AC
  Administered 2020-11-17: 4 mg via INTRAVENOUS
  Filled 2020-11-17: qty 2

## 2020-11-17 MED ORDER — PROMETHAZINE HCL 12.5 MG PO TABS: 12.5000 mg | ORAL_TABLET | Freq: Three times a day (TID) | ORAL | 0 refills | Status: AC | PRN

## 2020-11-17 MED ORDER — MORPHINE SULFATE (PF) 2 MG/ML IV SOLN
2.0000 mg | Freq: Once | INTRAVENOUS | Status: AC
  Administered 2020-11-17: 2 mg via INTRAVENOUS
  Filled 2020-11-17: qty 1

## 2020-11-17 MED ORDER — NITROFURANTOIN MONOHYD MACRO 100 MG PO CAPS: 100.0000 mg | ORAL_CAPSULE | Freq: Two times a day (BID) | ORAL | 0 refills | Status: AC

## 2020-11-17 NOTE — Discharge Instructions (Signed)
Seen here with left-sided flank pain.  Lab work shows that you may still have a UTI start you on antibiotic please take as prescribed.    Imaging all shows that you have a hemorrhagic cyst recommends repeat ultrasound in 6 to 12 weeks.  Please  follow-up with your OB/GYN for further evaluation.    Recommend taking over-the-counter pain medications like ibuprofen and or Tylenol every 6 hours as needed please follow dosing the back of bottle.  Come back to the emergency department if you develop chest pain, shortness of breath, severe abdominal pain, uncontrolled nausea, vomiting, diarrhea.

## 2020-11-17 NOTE — ED Provider Notes (Signed)
[facility] EMERGENCY DEPARTMENT Provider Note   CSN: [id number] Arrival date & time: 11/17/20  1734     History Chief Complaint  Patient presents with  . Flank Pain  . Dysuria    Whitney Parker is a 25 y.o. female.  HPI   Patient with significant medical history of ovarian cysts, herpes, presents to the emergency department with chief complaint of left sided flank pain and urinary symptoms.  Patient endorses that she has been experiencing urinary symptoms for the last few weeks, she endorses dysuria, urinary frequency, urinary urgency, with left-sided flank pain, nausea without vomiting, and subjective chills.  She denies vaginal bleeding, vaginal discharge, denies recent trauma to the area.  Patient states that she came to the emergency department on 02/03 where they diagnosed her with a UTI, had a negative CT renal, started on Keflex, urine culture contaminated.  She states she has been taking her medications as prescribed  but unfortunately pain has gotten worse over last couple days.  Patient endorses that she has had a right-sided ovarian cyst and it was surgically removed, she has no other significant abdominal history, no history of kidney stones, diverticulitis, bowel obstruction, she denies any alleviating factors at this time.  Patient denies headaches, fevers, chest pain, shortness of breath, abdominal pain, diarrhea, constipation, worsening pedal edema.  Past Medical History:  Diagnosis Date  . Anemia   . Asthma   . Depression   . Herpes   . Migraines   . Ovarian cyst     There are no problems to display for this patient.   Past Surgical History:  Procedure Laterality Date  . LAPAROSCOPIC OVARIAN CYSTECTOMY    . TONSILLECTOMY       OB History   No obstetric history on file.     No family history on file.  Social History   Tobacco Use  . Smoking status: Never Smoker  . Smokeless tobacco: Never Used  Vaping Use  . Vaping Use: Never used   Substance Use Topics  . Alcohol use: Yes  . Drug use: No    Home Medications Prior to Admission medications   Medication Sig Start Date End Date Taking? Authorizing Provider  nitrofurantoin, macrocrystal-monohydrate, (MACROBID) 100 MG capsule Take 1 capsule (100 mg total) by mouth 2 (two) times daily. 11/17/20  Yes Carroll Sage, PA-C  promethazine (PHENERGAN) 12.5 MG tablet Take 1 tablet (12.5 mg total) by mouth every 8 (eight) hours as needed for nausea or vomiting. 11/17/20  Yes Carroll Sage, PA-C  metoCLOPramide (REGLAN) 10 MG tablet Take 1 tablet (10 mg total) every 6 (six) hours as needed by mouth for nausea (nausea/headache). Patient not taking: No sig reported 08/12/17   Palumbo, April, MD  montelukast (SINGULAIR) 10 MG tablet Take 10 mg by mouth at bedtime.    [provider]  norethindrone (MICRONOR,CAMILA,ERRIN) 0.35 MG tablet Take 1 tablet by mouth daily.    [provider]  ondansetron (ZOFRAN ODT) 4 MG disintegrating tablet Take 1 tablet (4 mg total) by mouth every 8 (eight) hours as needed for nausea or vomiting. 10/29/20   Gailen Shelter, PA  PARoxetine (PAXIL) 20 MG tablet Take 20 mg by mouth daily.    [provider]  tiZANidine (ZANAFLEX) 4 MG tablet Take 4-8 mg by mouth at bedtime. 06/01/17   [provider]  valACYclovir (VALTREX) 500 MG tablet Take by mouth. 02/17/20   [provider]  zonisamide (ZONEGRAN) 100 MG capsule Take 500  mg by mouth at bedtime. 12/20/17   [provider]    Allergies    Bevelyn BucklesJusticia adhatoda (malabar nut tree) [justicia adhatoda], Other, Peanut-containing drug products, Compazine [prochlorperazine edisylate], Latex, Penicillins, Percocet [oxycodone-acetaminophen], Sulfa antibiotics, Tape, and Topamax [topiramate]  Review of Systems   Review of Systems  Constitutional: Positive for chills. Negative for fever.  HENT: Negative for congestion.   Respiratory: Negative for shortness of  breath.   Cardiovascular: Negative for chest pain.  Gastrointestinal: Positive for nausea. Negative for abdominal pain, constipation, diarrhea and vomiting.  Genitourinary: Positive for dysuria, flank pain, frequency and urgency. Negative for enuresis, hematuria, vaginal bleeding, vaginal discharge and vaginal pain.  Musculoskeletal: Negative for back pain.  Skin: Negative for rash.  Neurological: Negative for dizziness.  Hematological: Does not bruise/bleed easily.    Physical Exam Updated Vital Signs BP 115/88   Pulse 88   Temp 98.9 F (37.2 C) (Oral)   Resp 14   Ht 5\' 6"  (1.676 m)   Wt 68 kg   SpO2 100%   BMI 24.21 kg/m   Physical Exam Vitals and nursing note reviewed. Exam conducted with a chaperone present.  Constitutional:      General: She is not in acute distress.    Appearance: Normal appearance. She is not ill-appearing.  HENT:     Head: Normocephalic and atraumatic.     Nose: No congestion or rhinorrhea.  Eyes:     Conjunctiva/sclera: Conjunctivae normal.  Cardiovascular:     Rate and Rhythm: Normal rate and regular rhythm.     Pulses: Normal pulses.     Heart sounds: No murmur heard. No friction rub. No gallop.   Pulmonary:     Effort: No respiratory distress.     Breath sounds: No stridor. No wheezing, rhonchi or rales.  Abdominal:     General: There is no distension.     Palpations: Abdomen is soft.     Tenderness: There is abdominal tenderness. There is left CVA tenderness. There is no right CVA tenderness or guarding.     Comments: Patient's abdomen is visualized, nondistended, normoactive bowel sounds, dull to percussion.  She was tender to palpation in her pelvis region, negative McBurney point, negative rebound tenderness, negative Murphy sign.  She had positive left CVA tenderness.  Genitourinary:    General: Normal vulva.     Comments: Pelvic exam performed, exterior genitalia was examined there was no lesions, rashes or discharge noted.  Vaginal  canal was patent, pink, no lesions or trauma noted.  Cervix was visualized there was no lesions, discharge, or other abnormalities noted.  Patient had negative adnexal or chandelier sign. Musculoskeletal:     Comments: Patient is moving all 4 extremities without difficulty.  Skin:    General: Skin is warm and dry.  Neurological:     Mental Status: She is alert.  Psychiatric:        Mood and Affect: Mood normal.     ED Results / Procedures / Treatments   Labs (all labs ordered are listed, but only abnormal results are displayed) Labs Reviewed  WET PREP, GENITAL - Abnormal; Notable for the following components:      Result Value   WBC, Wet Prep HPF POC MANY (*)    All other components within normal limits  URINALYSIS, ROUTINE W REFLEX MICROSCOPIC - Abnormal; Notable for the following components:   Leukocytes,Ua LARGE (*)    All other components within normal limits  URINALYSIS, MICROSCOPIC (REFLEX) - Abnormal; Notable for  the following components:   Bacteria, UA RARE (*)    All other components within normal limits  BASIC METABOLIC PANEL - Abnormal; Notable for the following components:   Potassium 3.3 (*)    CO2 20 (*)    All other components within normal limits  CBC WITH DIFFERENTIAL/PLATELET - Abnormal; Notable for the following components:   RBC 5.36 (*)    Hemoglobin 10.9 (*)    HCT 34.1 (*)    MCV 63.6 (*)    MCH 20.3 (*)    RDW 16.2 (*)    Eosinophils Absolute 0.9 (*)    All other components within normal limits  URINE CULTURE  PREGNANCY, URINE  GC/CHLAMYDIA PROBE AMP ([place]) NOT AT [facility]    EKG None  Radiology US Renal  Result Date: 11/17/2020 CLINICAL DATA:  Left flank pain. EXAM: RENAL / URINARY TRACT ULTRASOUND COMPLETE COMPARISON:  None. FINDINGS: Right Kidney: Renal measurements: 9.9 cm x 4.2 cm x 5.1 cm = volume: 111.1 mL. Echogenicity within normal limits. No mass or hydronephrosis visualized. Left Kidney: Renal measurements: 9.8 cm x 4.7 cm x  4.4 cm = volume: 105.4 mL. Echogenicity within normal limits. A 2 mm shadowing echogenic focus is seen within the left kidney. No mass or hydronephrosis visualized. Bladder: Appears normal for degree of bladder distention. Other: None. IMPRESSION: 2 mm nonobstructing renal stone within the left kidney. Electronically Signed   By: Aram Candela M.D.   On: 11/17/2020 21:42   US PELVIC COMPLETE W TRANSVAGINAL AND TORSION R/O  Result Date: 11/17/2020 CLINICAL DATA:  Pelvic pain. EXAM: TRANSABDOMINAL AND TRANSVAGINAL ULTRASOUND OF PELVIS DOPPLER ULTRASOUND OF OVARIES TECHNIQUE: Both transabdominal and transvaginal ultrasound examinations of the pelvis were performed. Transabdominal technique was performed for global imaging of the pelvis including uterus, ovaries, adnexal regions, and pelvic cul-de-sac. It was necessary to proceed with endovaginal exam following the transabdominal exam to visualize the uterus, endometrium, bilateral ovaries and bilateral adnexa. Color and duplex Doppler ultrasound was utilized to evaluate blood flow to the ovaries. COMPARISON:  None. FINDINGS: Uterus Measurements: 6.5 cm x 2.6 cm x 4.2 cm = volume: 38 mL. No fibroids or other mass visualized. Endometrium Thickness: 2 mm. An IUD is in place. No focal abnormality visualized. Right ovary Measurements: 5.0 cm x 2.4 cm x 2.1 cm = volume: 13.2 mL. A 2.3 cm x 1.8 cm x 5.6 cm area of complex echogenicity is seen within the right ovary. No abnormal flow is seen within this region on color Doppler evaluation. Left ovary Measurements: 2.7 cm x 2.2 cm x 2.2 cm = volume: 6.7 mL. Normal appearance/no adnexal mass. Pulsed Doppler evaluation of both ovaries demonstrates normal low-resistance arterial and venous waveforms. Other findings A small amount of pelvic free fluid is noted near the right ovary. IMPRESSION: 1. Findings suggestive of a hemorrhagic right ovarian cyst. 6-12 week follow-up with either ultrasound or MRI should be considered to  ensure resolution. Reference: Radiology 2019 Nov; 293(2):359-371. 2. IUD in place. Electronically Signed   By: Aram Candela M.D.   On: 11/17/2020 21:56    Procedures Pelvic exam  Date/Time: 11/17/2020 8:53 PM Performed by: Carroll Sage, PA-C Authorized by: Carroll Sage, PA-C  Consent: Verbal consent obtained. Risks and benefits: risks, benefits and alternatives were discussed Consent given by: patient Patient identity confirmed: verbally with patient Preparation: Patient was prepped and draped in the usual sterile fashion. Local anesthesia used: no  Anesthesia: Local anesthesia used: no  Sedation: Patient sedated: no  Medications Ordered in ED Medications  ondansetron (ZOFRAN) injection 4 mg (4 mg Intravenous Given 11/17/20 1927)  fentaNYL (SUBLIMAZE) injection 50 mcg (50 mcg Intravenous Given 11/17/20 1927)  morphine 4 MG/ML injection 4 mg (4 mg Intravenous Given 11/17/20 2115)  promethazine (PHENERGAN) injection 12.5 mg (12.5 mg Intravenous Given 11/17/20 2112)  morphine 2 MG/ML injection 2 mg (2 mg Intravenous Given 11/17/20 2224)  promethazine (PHENERGAN) injection 6.25 mg (6.25 mg Intravenous Given 11/17/20 2221)    ED Course  I have reviewed the triage vital signs and the nursing notes.  Pertinent labs & imaging results that were available during my care of the patient were reviewed by me and considered in my medical decision making (see chart for details).    MDM Rules/Calculators/A&P                          Initial impression-patient presents with urinary symptoms and left-sided flank pain.  She is alert, does not appear in acute distress, vital signs reassuring.  Will obtain basic lab work, recommend pelvic exam, proceed with pelvic ultrasound as well as renal ultrasound.  Will provide patient with antiemetics and pain medication.  Work-up-CBC negative for leukocytosis, shows microcytic anemia appears to be a baseline for patient.  BMP shows  slight hypokalemia 3.3, slight decrease in CO2 of 20, no anion gap present.  UA shows large leukocytes, no red blood cells, 11-20 white blood cells, rare bacteria.  Wet prep shows many white blood cells.  Urine pregnancy is negative at this time.  Renal ultrasound shows 2 mm nonobstructing renal stones with the left kidney.  Transvaginal ultrasound shows hemorrhagic right ovarian cyst recommend 6 to 12-week follow-up  Reassessment patient was reassessed after providing her with pain medication and antiemetics, patient states she is feeling much better, states pain and nausea is much more tolerable.  Vital signs have remained stable.  Patient is agreeable for discharge.  Rule out-I have low suspicion for ectopic pregnancy as urine pregnancy test is negative.  Low suspicion for ovarian torsion as transvaginal ultrasound is negative, with negative adnexal pain on pelvic exam. Low suspicion for PID as she has negative adnexal pain or chandelier sign, patient denies vaginal discharge or vaginal bleeding will defer antibiotics at this time.  Low suspicion for kidney stone as UA shows no hematuria, will defer further imaging as patient recently had imaging performed showing a small nonobstructive renal stone in the right kidney this was also confirmed by renal ultrasound.  Low suspicion for diverticulitis as patient does not have left lower quadrant pain, there is no leukocytosis, patient is nontoxic-appearing.  Low suspicion for appendicitis as patient had no right lower quadrant pain, no leukocytosis on lab work, patient is nontoxic-appearing history is atypical.  Low suspicion for pyelonephritis as there is no leukocytosis on cbc, patient is nontoxic-appearing, vital signs reassuring.  Patient's UA is abnormal with large leukocytes, 11-20 white blood cells and rare bacteria.  It is improved from prior UA will culture and start patient on another round of antibiotics as she continues to have dysuria.   Plan-I  suspect patient's pain is multifactorial, suspect she may have referral pain from her hemorrhagic cyst as well  unresolved UTI.  Will start patient on Macrobid recommend that she follows up with her primary care provider week time for repeat UA.  Vital signs have remained stable, no indication for hospital admission.  Patient discussed with attending and they agreed with assessment and  plan.  Patient given at home care as well strict return precautions.  Patient verbalized that they understood agreed to said plan.   Final Clinical Impression(s) / ED Diagnoses Final diagnoses:  Hemorrhagic ovarian cyst  Acute cystitis without hematuria    Rx / DC Orders ED Discharge Orders         Ordered    nitrofurantoin, macrocrystal-monohydrate, (MACROBID) 100 MG capsule  2 times daily        11/17/20 2211    promethazine (PHENERGAN) 12.5 MG tablet  Every 8 hours PRN        11/17/20 2211           Carroll Sage, PA-C 11/18/20 0000    Charlynne Pander, MD 11/18/20 [phone]

## 2020-11-17 NOTE — ED Triage Notes (Signed)
Pts that she was seen here about 2 weeks ago for a kidney infection states that she is still having left flank pain, abdominal pain, increased fatigue, reports brown urine with pain when urinating. States that she has been taking her keflex X2 weeks now.

## 2020-11-18 LAB — GC/CHLAMYDIA PROBE AMP (~~LOC~~) NOT AT ARMC
Chlamydia: NEGATIVE
Comment: NEGATIVE
Comment: NORMAL
Neisseria Gonorrhea: NEGATIVE

## 2020-11-19 LAB — URINE CULTURE: Culture: <10000 — AB

## 2021-03-22 ENCOUNTER — Other Ambulatory Visit: Payer: Self-pay

## 2021-03-22 ENCOUNTER — Emergency Department (HOSPITAL_BASED_OUTPATIENT_CLINIC_OR_DEPARTMENT_OTHER)
Admission: EM | Admit: 2021-03-22 | Discharge: 2021-03-22 | Disposition: A | Discharge status: Home / Self Care | Payer: BC Managed Care – PPO | Attending: Emergency Medicine | Admitting: Emergency Medicine

## 2021-03-22 ENCOUNTER — Encounter (HOSPITAL_BASED_OUTPATIENT_CLINIC_OR_DEPARTMENT_OTHER): Payer: Self-pay | Admitting: Emergency Medicine

## 2021-03-22 ENCOUNTER — Emergency Department (HOSPITAL_BASED_OUTPATIENT_CLINIC_OR_DEPARTMENT_OTHER): Payer: BC Managed Care – PPO

## 2021-03-22 DIAGNOSIS — R1032 Left lower quadrant pain: Secondary | ICD-10-CM

## 2021-03-22 DIAGNOSIS — N132 Hydronephrosis with renal and ureteral calculous obstruction: Secondary | ICD-10-CM | POA: Insufficient documentation

## 2021-03-22 DIAGNOSIS — R102 Pelvic and perineal pain: Secondary | ICD-10-CM

## 2021-03-22 DIAGNOSIS — Z91040 Latex allergy status: Secondary | ICD-10-CM | POA: Insufficient documentation

## 2021-03-22 DIAGNOSIS — Z91010 Allergy to peanuts: Secondary | ICD-10-CM | POA: Diagnosis not present

## 2021-03-22 DIAGNOSIS — J45909 Unspecified asthma, uncomplicated: Secondary | ICD-10-CM | POA: Insufficient documentation

## 2021-03-22 LAB — CBC WITH DIFFERENTIAL/PLATELET
Abs Immature Granulocytes: 0.04 10*3/uL (ref 0.00–0.07)
Basophils Absolute: 0.1 10*3/uL (ref 0.0–0.1)
Basophils Relative: 1 %
Eosinophils Absolute: 0.4 10*3/uL (ref 0.0–0.5)
Eosinophils Relative: 3 %
HCT: 34.8 % — ABNORMAL LOW (ref 36.0–46.0)
Hemoglobin: 11.4 g/dL — ABNORMAL LOW (ref 12.0–15.0)
Immature Granulocytes: 0 %
Lymphocytes Relative: 14 %
Lymphs Abs: 1.7 10*3/uL (ref 0.7–4.0)
MCH: 21.3 pg — ABNORMAL LOW (ref 26.0–34.0)
MCHC: 32.8 g/dL (ref 30.0–36.0)
MCV: 64.9 fL — ABNORMAL LOW (ref 80.0–100.0)
Monocytes Absolute: 0.5 10*3/uL (ref 0.1–1.0)
Monocytes Relative: 4 %
Neutro Abs: 9 10*3/uL — ABNORMAL HIGH (ref 1.7–7.7)
Neutrophils Relative %: 78 %
Platelets: 349 10*3/uL (ref 150–400)
RBC: 5.36 MIL/uL — ABNORMAL HIGH (ref 3.87–5.11)
RDW: 16.5 % — ABNORMAL HIGH (ref 11.5–15.5)
WBC: 11.7 10*3/uL — ABNORMAL HIGH (ref 4.0–10.5)
nRBC: 0 % (ref 0.0–0.2)

## 2021-03-22 LAB — URINALYSIS, ROUTINE W REFLEX MICROSCOPIC
Bilirubin Urine: NEGATIVE
Glucose, UA: NEGATIVE mg/dL
Ketones, ur: NEGATIVE mg/dL
Nitrite: NEGATIVE
Protein, ur: 30 mg/dL — AB
Specific Gravity, Urine: 1.025 (ref 1.005–1.030)
pH: 6 (ref 5.0–8.0)

## 2021-03-22 LAB — PREGNANCY, URINE: Preg Test, Ur: NEGATIVE

## 2021-03-22 LAB — URINALYSIS, MICROSCOPIC (REFLEX)

## 2021-03-22 LAB — BASIC METABOLIC PANEL
Anion gap: 8 (ref 5–15)
BUN: 11 mg/dL (ref 6–20)
CO2: 18 mmol/L — ABNORMAL LOW (ref 22–32)
Calcium: 9.3 mg/dL (ref 8.9–10.3)
Chloride: 110 mmol/L (ref 98–111)
Creatinine, Ser: 0.86 mg/dL (ref 0.44–1.00)
GFR, Estimated: >60 mL/min (ref >60)
Glucose, Bld: 103 mg/dL — ABNORMAL HIGH (ref 70–99)
Potassium: 3.9 mmol/L (ref 3.5–5.1)
Sodium: 136 mmol/L (ref 135–145)

## 2021-03-22 MED ORDER — KETOROLAC TROMETHAMINE 15 MG/ML IJ SOLN
15.0000 mg | Freq: Once | INTRAMUSCULAR | Status: AC
  Administered 2021-03-22: 15 mg via INTRAVENOUS
  Filled 2021-03-22: qty 1

## 2021-03-22 MED ORDER — DIPHENHYDRAMINE HCL 50 MG/ML IJ SOLN
25.0000 mg | Freq: Once | INTRAMUSCULAR | Status: AC
  Administered 2021-03-22: 25 mg via INTRAVENOUS
  Filled 2021-03-22: qty 1

## 2021-03-22 MED ORDER — METOCLOPRAMIDE HCL 5 MG/ML IJ SOLN
10.0000 mg | Freq: Once | INTRAMUSCULAR | Status: AC
  Administered 2021-03-22: 10 mg via INTRAVENOUS
  Filled 2021-03-22: qty 2

## 2021-03-22 MED ORDER — ONDANSETRON HCL 4 MG/2ML IJ SOLN
4.0000 mg | Freq: Once | INTRAMUSCULAR | Status: AC
  Administered 2021-03-22: 4 mg via INTRAVENOUS
  Filled 2021-03-22: qty 2

## 2021-03-22 MED ORDER — SODIUM CHLORIDE 0.9 % IV SOLN: 8.0000 mg | Freq: Once | INTRAVENOUS | Status: DC

## 2021-03-22 MED ORDER — PROMETHAZINE HCL 25 MG PO TABS: 25.0000 mg | ORAL_TABLET | Freq: Four times a day (QID) | ORAL | 0 refills | Status: AC | PRN

## 2021-03-22 MED ORDER — MORPHINE SULFATE (PF) 4 MG/ML IV SOLN
6.0000 mg | Freq: Once | INTRAVENOUS | Status: AC
  Administered 2021-03-22: 6 mg via INTRAVENOUS
  Filled 2021-03-22: qty 2

## 2021-03-22 MED ORDER — LORAZEPAM 2 MG/ML IJ SOLN
1.0000 mg | Freq: Once | INTRAMUSCULAR | Status: AC
  Administered 2021-03-22: 1 mg via INTRAVENOUS
  Filled 2021-03-22: qty 1

## 2021-03-22 MED ORDER — SODIUM CHLORIDE 0.9 % IV BOLUS
1000.0000 mL | Freq: Once | INTRAVENOUS | Status: AC
  Administered 2021-03-22: 1000 mL via INTRAVENOUS

## 2021-03-22 MED ORDER — ONDANSETRON HCL 4 MG/2ML IJ SOLN: 4.0000 mg | Freq: Once | INTRAMUSCULAR | Status: DC

## 2021-03-22 NOTE — ED Notes (Signed)
Patient transported to Ultrasound 

## 2021-03-22 NOTE — ED Triage Notes (Signed)
Pt presents to ED POV. Pt c/o abd pain. Pt reports that she thinks her ovarian cyst ruptured. Hx of same. Pt also reports n/v.

## 2021-03-22 NOTE — ED Notes (Signed)
Pt unable to provide urine specimen. Reports sudden severe LLQ abdominal pain beginning prior to arrival. Admits hx with ovarian cysts.

## 2021-03-22 NOTE — Discharge Instructions (Addendum)
Take 8 scoops of miralax in 32oz of whatever you would like to drink.(Gatorade comes in this size) You can also use a fleets enema which you can buy over the counter at the pharmacy.  Return for worsening abdominal pain, vomiting or fever. ? ?

## 2021-03-22 NOTE — ED Notes (Signed)
Pt assisted to restroom for urine specimen via wheelchair.

## 2021-03-22 NOTE — ED Provider Notes (Signed)
[facility] EMERGENCY DEPARTMENT Provider Note   CSN: [id number] Arrival date & time: 03/22/21  1937     History Chief Complaint  Patient presents with   Abdominal Pain    Whitney Parker is a 25 y.o. female.  25 yo F if not with a chief complaints of left adnexal pain.  Started spontaneously while she was exercising.  She has been having unrelenting pain that does not seem to get better with any movement.  Has been having a couple episodes of vomiting.  Feeling hot but no fevers.  The history is provided by the patient.  Abdominal Pain Pain location:  Generalized Pain quality: sharp and shooting   Pain radiates to:  Does not radiate Pain severity:  Moderate Onset quality:  Sudden Duration:  2 hours Timing:  Constant Chronicity:  New Relieved by:  Nothing Worsened by:  Nothing Ineffective treatments:  None tried Associated symptoms: nausea and vomiting   Associated symptoms: no chest pain, no chills, no dysuria, no fever and no shortness of breath       Past Medical History:  Diagnosis Date   Anemia    Asthma    Depression    Herpes    Migraines    Ovarian cyst     There are no problems to display for this patient.   Past Surgical History:  Procedure Laterality Date   LAPAROSCOPIC OVARIAN CYSTECTOMY     TONSILLECTOMY       OB History   No obstetric history on file.     History reviewed. No pertinent family history.  Social History   Tobacco Use   Smoking status: Never   Smokeless tobacco: Never  Vaping Use   Vaping Use: Never used  Substance Use Topics   Alcohol use: Yes   Drug use: No    Home Medications Prior to Admission medications   Medication Sig Start Date End Date Taking? Authorizing Provider  promethazine (PHENERGAN) 25 MG tablet Take 1 tablet (25 mg total) by mouth every 6 (six) hours as needed for up to 10 doses for nausea or vomiting. 03/22/21  Yes Melene PlanFloyd, Thomos Domine, DO  metoCLOPramide (REGLAN) 10 MG tablet Take 1 tablet (10 mg  total) every 6 (six) hours as needed by mouth for nausea (nausea/headache). Patient not taking: No sig reported 08/12/17   Palumbo, April, MD  montelukast (SINGULAIR) 10 MG tablet Take 10 mg by mouth at bedtime.    [provider]  nitrofurantoin, macrocrystal-monohydrate, (MACROBID) 100 MG capsule Take 1 capsule (100 mg total) by mouth 2 (two) times daily. 11/17/20   Carroll SageFaulkner, William J, PA-C  norethindrone (MICRONOR,CAMILA,ERRIN) 0.35 MG tablet Take 1 tablet by mouth daily.    [provider]  ondansetron (ZOFRAN ODT) 4 MG disintegrating tablet Take 1 tablet (4 mg total) by mouth every 8 (eight) hours as needed for nausea or vomiting. 10/29/20   Gailen ShelterFondaw, Wylder S, PA  PARoxetine (PAXIL) 20 MG tablet Take 20 mg by mouth daily.    [provider]  promethazine (PHENERGAN) 12.5 MG tablet Take 1 tablet (12.5 mg total) by mouth every 8 (eight) hours as needed for nausea or vomiting. 11/17/20   Carroll SageFaulkner, William J, PA-C  tiZANidine (ZANAFLEX) 4 MG tablet Take 4-8 mg by mouth at bedtime. 06/01/17   [provider]  valACYclovir (VALTREX) 500 MG tablet Take by mouth. 02/17/20   [provider]  zonisamide (ZONEGRAN) 100 MG capsule Take 500 mg by mouth at bedtime. 12/20/17   [provider]    Allergies    Justicia adhatoda (malabar nut tree) [justicia adhatoda], Other, Peanut-containing drug products, Compazine [prochlorperazine edisylate], Latex, Penicillins, Percocet [oxycodone-acetaminophen], Sulfa antibiotics, Tape, and Topamax [topiramate]  Review of Systems   Review of Systems  Constitutional:  Negative for chills and fever.  HENT:  Negative for congestion and rhinorrhea.   Eyes:  Negative for redness and visual disturbance.  Respiratory:  Negative for shortness of breath and wheezing.   Cardiovascular:  Negative for chest pain and palpitations.  Gastrointestinal:  Positive for abdominal pain, nausea and vomiting.  Genitourinary:  Negative for  dysuria and urgency.  Musculoskeletal:  Negative for arthralgias and myalgias.  Skin:  Negative for pallor and wound.  Neurological:  Negative for dizziness and headaches.   Physical Exam Updated Vital Signs BP 121/78   Pulse (!) 105   Temp 98.2 F (36.8 C) (Oral)   Resp 18   SpO2 100%   Physical Exam Vitals and nursing note reviewed.  Constitutional:      General: She is not in acute distress.    Appearance: She is well-developed. She is not diaphoretic.     Comments: Patient appears uncomfortable actively writhing around on the stretcher.  HENT:     Head: Normocephalic and atraumatic.  Eyes:     Pupils: Pupils are equal, round, and reactive to light.  Cardiovascular:     Rate and Rhythm: Normal rate and regular rhythm.     Heart sounds: No murmur heard.   No friction rub. No gallop.  Pulmonary:     Effort: Pulmonary effort is normal.     Breath sounds: No wheezing or rales.  Abdominal:     General: There is no distension.     Palpations: Abdomen is soft.     Tenderness: There is abdominal tenderness.     Comments: Tenderness in the left lower quadrant though worse the lower I get.  Musculoskeletal:        General: No tenderness.     Cervical back: Normal range of motion and neck supple.  Skin:    General: Skin is warm and dry.  Neurological:     Mental Status: She is alert and oriented to person, place, and time.  Psychiatric:        Behavior: Behavior normal.    ED Results / Procedures / Treatments   Labs (all labs ordered are listed, but only abnormal results are displayed) Labs Reviewed  CBC WITH DIFFERENTIAL/PLATELET - Abnormal; Notable for the following components:      Result Value   WBC 11.7 (*)    RBC 5.36 (*)    Hemoglobin 11.4 (*)    HCT 34.8 (*)    MCV 64.9 (*)    MCH 21.3 (*)    RDW 16.5 (*)    Neutro Abs 9.0 (*)    All other components within normal limits  BASIC METABOLIC PANEL - Abnormal; Notable for the following components:   CO2 18  (*)    Glucose, Bld 103 (*)    All other components within normal limits  URINALYSIS, ROUTINE W REFLEX MICROSCOPIC - Abnormal; Notable for the following components:   APPearance CLOUDY (*)    Hgb urine dipstick MODERATE (*)    Protein, ur 30 (*)    Leukocytes,Ua TRACE (*)    All other components within normal limits  URINALYSIS, MICROSCOPIC (REFLEX) - Abnormal; Notable for the following components:   Bacteria, UA MANY (*)    All other components within  normal limits  PREGNANCY, URINE    EKG None  Radiology CT Renal Stone Study  Result Date: 03/22/2021 CLINICAL DATA:  Flank pain. EXAM: CT ABDOMEN AND PELVIS WITHOUT CONTRAST TECHNIQUE: Multidetector CT imaging of the abdomen and pelvis was performed following the standard protocol without IV contrast. COMPARISON:  October 29, 2020 FINDINGS: Lower chest: No acute abnormality. Hepatobiliary: No focal liver abnormality is seen. No gallstones, gallbladder wall thickening, or biliary dilatation. Pancreas: Unremarkable. No pancreatic ductal dilatation or surrounding inflammatory changes. Spleen: Normal in size without focal abnormality. Adrenals/Urinary Tract: Adrenal glands are unremarkable. Kidneys are normal in size, without focal lesions. Mild right-sided hydronephrosis and hydroureter are seen without obstructing renal stones. A 3 mm nonobstructing renal stone is noted within the mid left kidney. Bladder is unremarkable. Stomach/Bowel: Stomach is within normal limits. Appendix appears normal. No evidence of bowel wall thickening, distention, or inflammatory changes. Vascular/Lymphatic: No significant vascular findings are present. No enlarged abdominal or pelvic lymph nodes. Reproductive: An IUD is seen within an otherwise normal appearing uterus. The bilateral adnexa are unremarkable. Other: No abdominal wall hernia or abnormality. No abdominopelvic ascites. Musculoskeletal: No acute or significant osseous findings. IMPRESSION: 1. Mild right-sided  hydronephrosis and hydroureter without obstructing renal stones. 2. 3 mm nonobstructing renal stone within the left kidney. Electronically Signed   By: Aram Candela M.D.   On: 03/22/2021 23:01   US PELVIC COMPLETE W TRANSVAGINAL AND TORSION R/O  Result Date: 03/22/2021 CLINICAL DATA:  25 year old female with left pelvic pain. EXAM: TRANSABDOMINAL AND TRANSVAGINAL ULTRASOUND OF PELVIS DOPPLER ULTRASOUND OF OVARIES TECHNIQUE: Both transabdominal and transvaginal ultrasound examinations of the pelvis were performed. Transabdominal technique was performed for global imaging of the pelvis including uterus, ovaries, adnexal regions, and pelvic cul-de-sac. It was necessary to proceed with endovaginal exam following the transabdominal exam to visualize the endometrium and ovaries. Color and duplex Doppler ultrasound was utilized to evaluate blood flow to the ovaries. COMPARISON:  Ultrasound dated 11/17/2020. FINDINGS: Uterus Measurements: 6.7 x 3.1 x 3.6 cm = volume: 39 mL. No fibroids or other mass visualized. Endometrium Thickness: 2 mm. An intrauterine device is noted which appears in appropriate positioning. Right ovary Measurements: 3.5 x 2.0 x 2.3 = volume: 8 mL. Normal appearance/no adnexal mass. Left ovary Measurements: 4.1 x 3.0 x 2.3 cm = volume: 15 mL. Normal appearance/no adnexal mass. Pulsed Doppler evaluation of both ovaries demonstrates normal low-resistance arterial and venous waveforms. Other findings No abnormal free fluid. IMPRESSION: 1. Unremarkable pelvic ultrasound. 2. Intrauterine device appears in appropriate position. Electronically Signed   By: Elgie Collard M.D.   On: 03/22/2021 21:57    Procedures Procedures   Medications Ordered in ED Medications  ketorolac (TORADOL) 15 MG/ML injection 15 mg (has no administration in time range)  sodium chloride 0.9 % bolus 1,000 mL (0 mLs Intravenous Stopped 03/22/21 2117)  morphine 4 MG/ML injection 6 mg (6 mg Intravenous Given 03/22/21  2054)  LORazepam (ATIVAN) injection 1 mg (1 mg Intravenous Given 03/22/21 2052)  ondansetron (ZOFRAN) injection 4 mg (4 mg Intravenous Given 03/22/21 2049)    Followed by  ondansetron [facility]) injection 4 mg (4 mg Intravenous Given 03/22/21 2051)  morphine 4 MG/ML injection 6 mg (6 mg Intravenous Given 03/22/21 2159)  metoCLOPramide (REGLAN) injection 10 mg (10 mg Intravenous Given 03/22/21 2200)  diphenhydrAMINE (BENADRYL) injection 25 mg (25 mg Intravenous Given 03/22/21 2200)    ED Course  I have reviewed the triage vital signs and the nursing notes.  Pertinent labs &  imaging results that were available during my care of the patient were reviewed by me and considered in my medical decision making (see chart for details).    MDM Rules/Calculators/A&P                          25 yo F with a chief complaints of left lower abdominal pain.  This started suddenly just prior to arrival.  Patient was getting ready to start exercising and had sudden severe pain.  Has had some diarrhea over the past couple days.  Denied any significant symptoms with that.  No fevers or chills but feels hot and cold with vomiting and writhing around on stretcher.  Patient does have a history of ovarian cysts and thinks this feels somewhat similar but much worse.  We will obtain an ultrasound to assess for ovarian torsion.  Treat pain and nausea.  Patient has an intolerance to multiple medications.  Requesting 8 mg of Zofran upfront and Ativan.  The patient's ultrasound is negative for pelvic pathology.  No ovarian torsion no ovarian cyst.  Due to the patient's presentation we will now obtain a CT stone study as it was concerning for a kidney stone.  Blood work had returned as well without significant finding.  Not pregnant hematuria without signs of infection.  CT scan has returned without obvious intra-abdominal pathology.  Patient continues to feel a bit better.  She does feel like it is coming in waves and is a bit  crampy.  Is the patient's work-up has returned and is unremarkable we will have her follow-up with her family doctor.  Trial of MiraLAX with colicky abdominal pain  and no significant finding on imaging or labs.    11:22 PM:  I have discussed the diagnosis/risks/treatment options with the patient and believe the pt to be eligible for discharge home to follow-up with PCP. We also discussed returning to the ED immediately if new or worsening sx occur. We discussed the sx which are most concerning (e.g., sudden worsening pain, fever, inability to tolerate by mouth) that necessitate immediate return. Medications administered to the patient during their visit and any new prescriptions provided to the patient are listed below.  Medications given during this visit Medications  ketorolac (TORADOL) 15 MG/ML injection 15 mg (has no administration in time range)  sodium chloride 0.9 % bolus 1,000 mL (0 mLs Intravenous Stopped 03/22/21 2117)  morphine 4 MG/ML injection 6 mg (6 mg Intravenous Given 03/22/21 2054)  LORazepam (ATIVAN) injection 1 mg (1 mg Intravenous Given 03/22/21 2052)  ondansetron (ZOFRAN) injection 4 mg (4 mg Intravenous Given 03/22/21 2049)    Followed by  ondansetron [facility]) injection 4 mg (4 mg Intravenous Given 03/22/21 2051)  morphine 4 MG/ML injection 6 mg (6 mg Intravenous Given 03/22/21 2159)  metoCLOPramide (REGLAN) injection 10 mg (10 mg Intravenous Given 03/22/21 2200)  diphenhydrAMINE (BENADRYL) injection 25 mg (25 mg Intravenous Given 03/22/21 2200)     The patient appears reasonably screen and/or stabilized for discharge and I doubt any other medical condition or other [facility] requiring further screening, evaluation, or treatment in the ED at this time prior to discharge.   Final Clinical Impression(s) / ED Diagnoses Final diagnoses:  LLQ abdominal pain    Rx / DC Orders ED Discharge Orders          Ordered    promethazine (PHENERGAN) 25 MG tablet  Every 6 hours PRN  03/22/21 2316             Melene Plan, DO 03/22/21 2322

## 2021-03-22 NOTE — ED Notes (Signed)
Patient transported to room from ultrasound

## 2022-09-12 IMAGING — CT CT RENAL STONE PROTOCOL
2 of 4 series · 16 of 46 positions shown, 18 images · non-contrast
Comparison: CT renal stone protocol January 28, 2018 and CT abdomen
pelvis November 02, 2018.

CLINICAL DATA: Flank pain renal stones suspected, clinically
patient has pyelonephritis. Right-sided flank pain, dysuria,
frequent UTIs. History of areae in cystectomy.

EXAM:
CT ABDOMEN AND PELVIS WITHOUT CONTRAST
TECHNIQUE: Multidetector CT imaging of the abdomen and pelvis was performed
following the standard protocol without IV contrast.

[Series 2: axial st · axial · 0.74mm/px · z∈[-555,-135]mm · 13 of 92 slices shown, 15 images]
[im 4/92  soft-tissue]
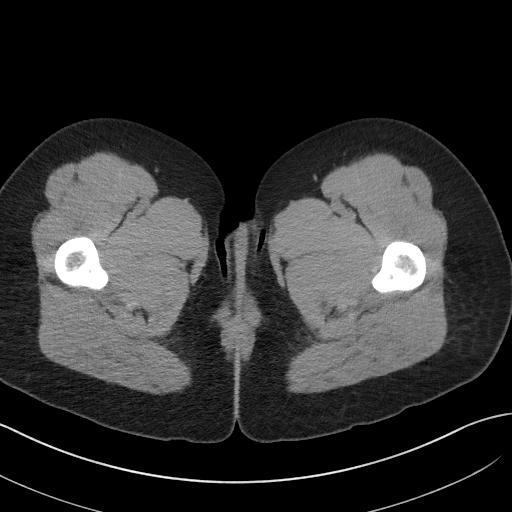
[im 4/92  bone]
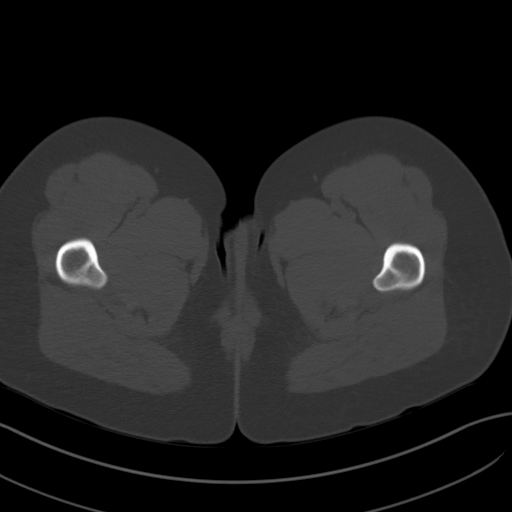
[im 11/92  soft-tissue]
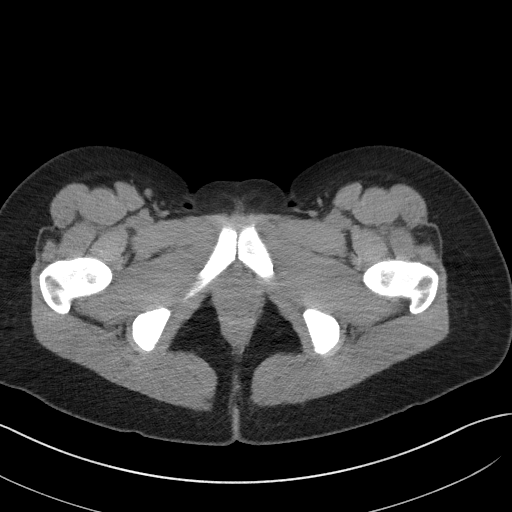
[im 19/92  soft-tissue]
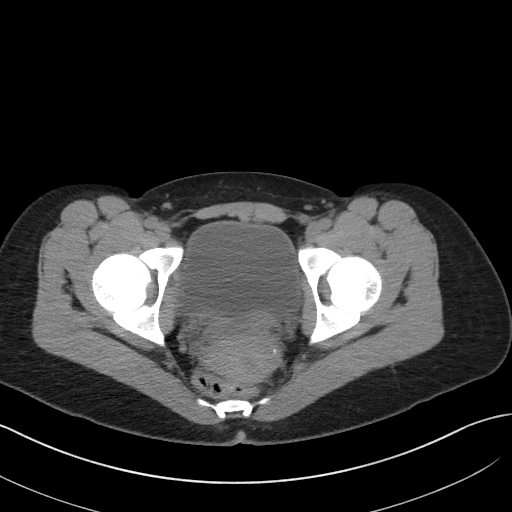
[im 26/92  soft-tissue]
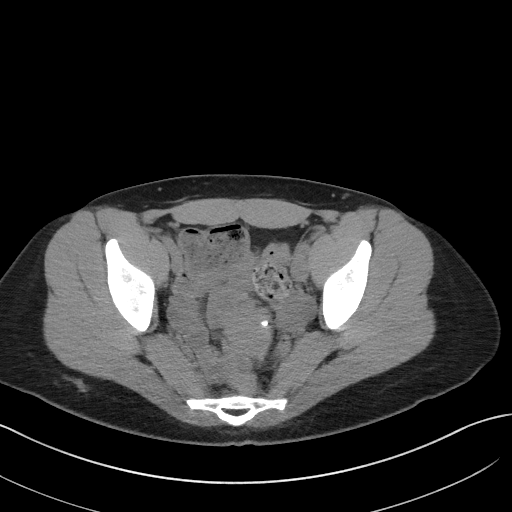
[im 33/92  soft-tissue]
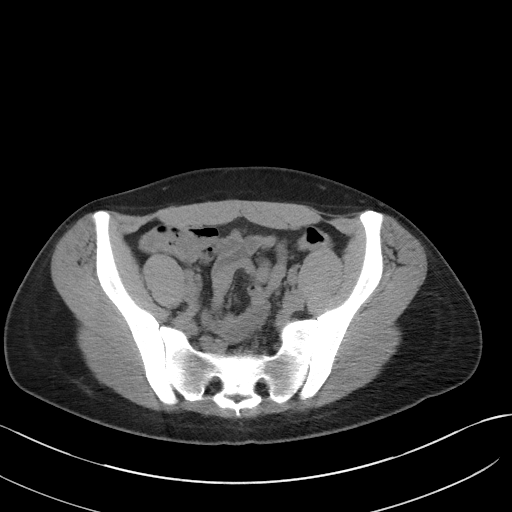
[im 41/92  soft-tissue]
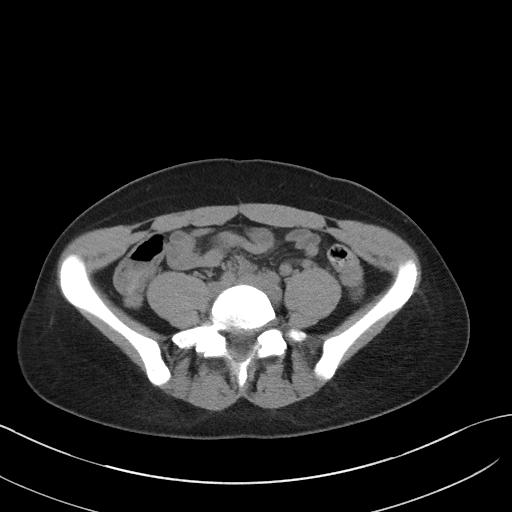
[im 48/92  soft-tissue]
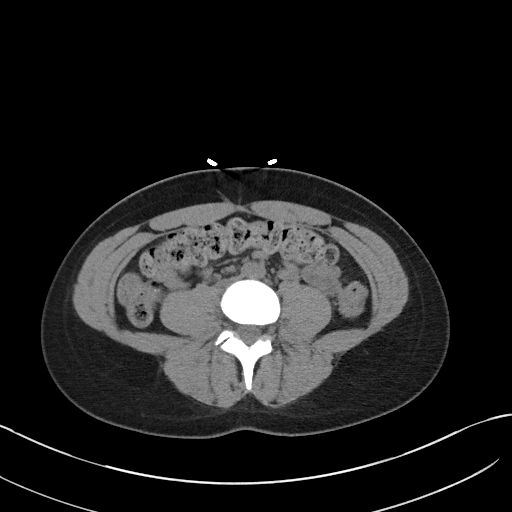
[im 51/92  soft-tissue]
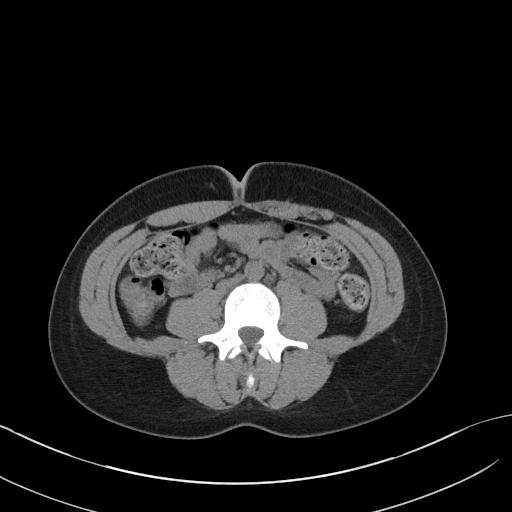
[im 59/92  soft-tissue]
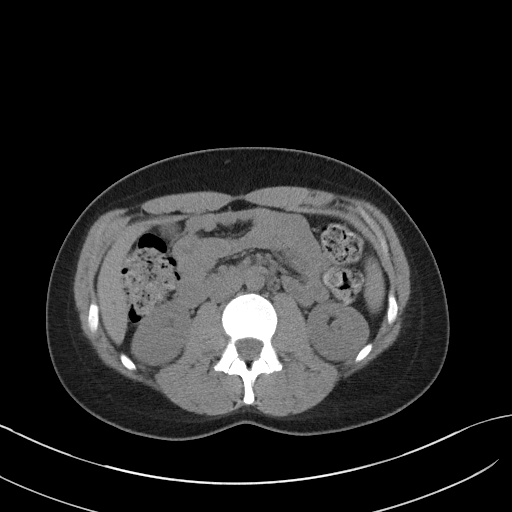
[im 59/92  bone]
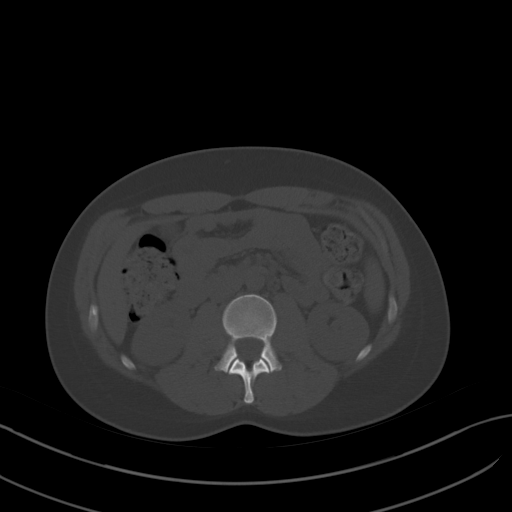
[im 66/92  soft-tissue]
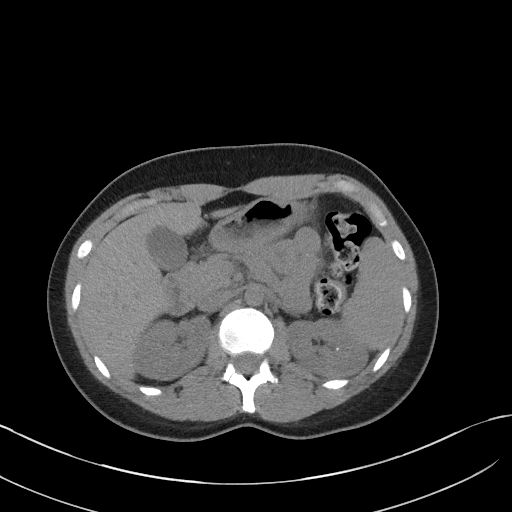
[im 73/92  soft-tissue]
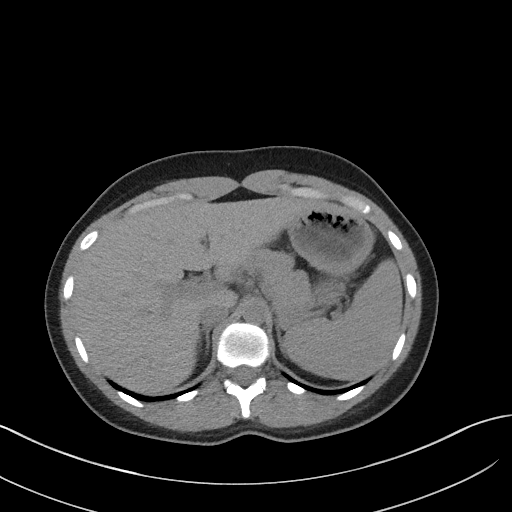
[im 81/92  soft-tissue]
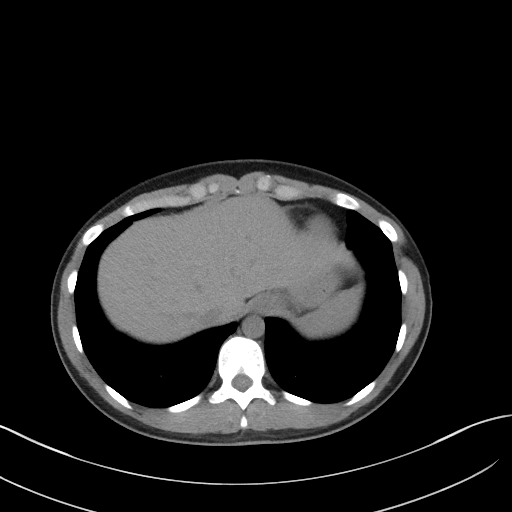
[im 88/92  soft-tissue]
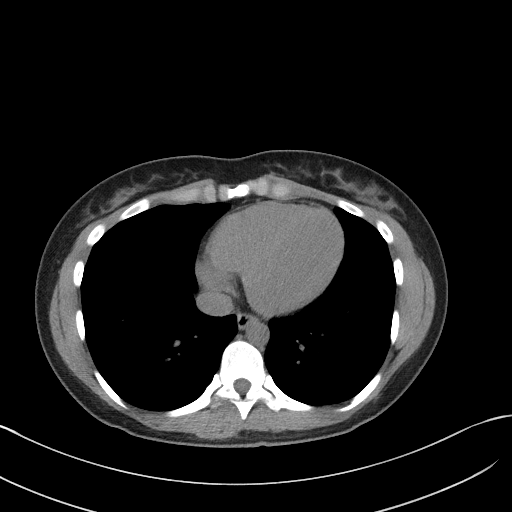

[Series 4: coronal st · coronal · 0.78mm/px · 3 of 77 slices shown]
[im 26/77  soft-tissue]
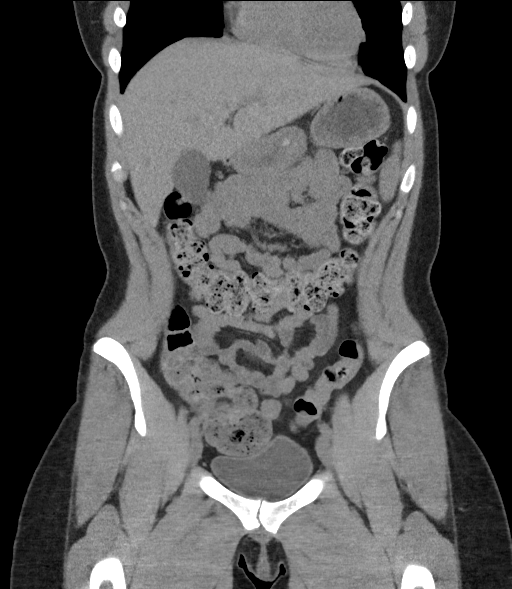
[im 34/77  soft-tissue]
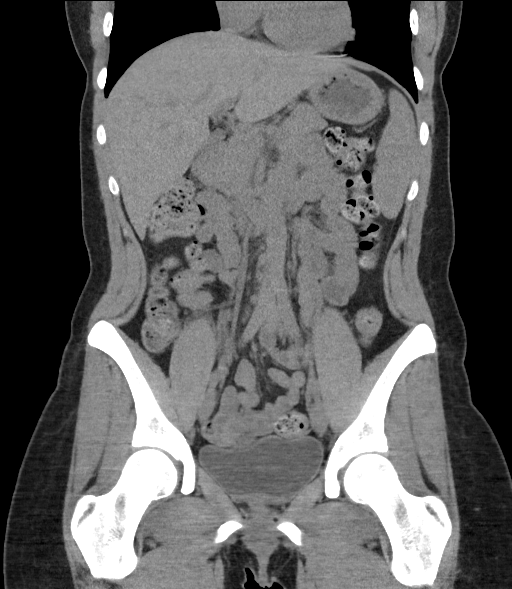
[im 43/77  soft-tissue]
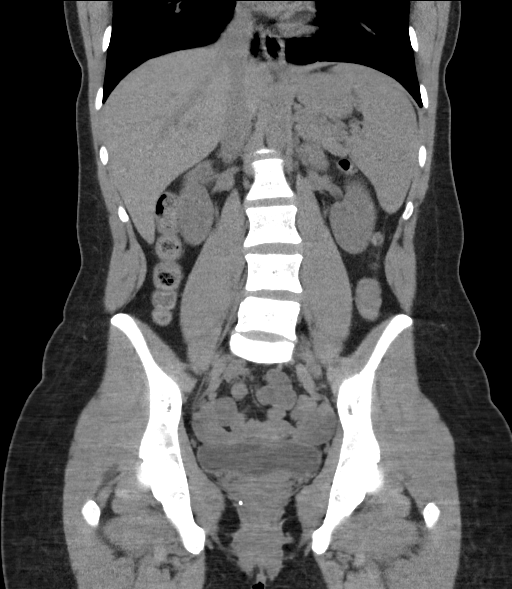

[16 of 46 positions shown; findings below may reference images not displayed]

FINDINGS: Lower chest: No acute abnormality.

Hepatobiliary: Unremarkable noncontrast appearance of the hepatic
parenchyma. Gallbladder is unremarkable. No biliary ductal
dilatation.

Pancreas: Unremarkable

Spleen: Unremarkable

Adrenals/Urinary Tract: Adrenal glands are unremarkable. Tiny
left-sided nonobstructive 1-2 mm renal stones. No hydronephrosis.
Bladder is unremarkable.

Stomach/Bowel: Stomach and small bowel are unremarkable. Appendix is
not definitely visualized however there is no inflammatory stranding
in the right lower quadrant to suggest acute inflammation. Small
volume of formed stool throughout the colon.

Vascular/Lymphatic: No significant vascular findings are present. No
enlarged abdominal or pelvic lymph nodes.

Reproductive: IUD in place, otherwise unremarkable.

Other: No abdominopelvic ascites.

Musculoskeletal: No acute or significant osseous findings.
IMPRESSION: Tiny nonobstructive left renal stones. No hydronephrosis or
nephroureterolithiasis.

## 2023-02-03 IMAGING — CT CT RENAL STONE PROTOCOL
2 of 4 series · 17 of 46 positions shown, 19 images · non-contrast
Comparison: October 29, 2020

CLINICAL DATA: Flank pain.

EXAM:
CT ABDOMEN AND PELVIS WITHOUT CONTRAST
TECHNIQUE: Multidetector CT imaging of the abdomen and pelvis was performed
following the standard protocol without IV contrast.

[Series 2: axial st · axial · 0.78mm/px · z∈[-750,-330]mm · 14 of 92 slices shown, 16 images]
[im 4/92  soft-tissue]
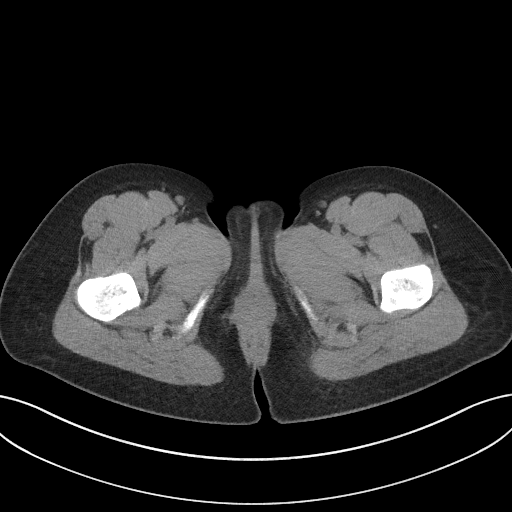
[im 4/92  bone]
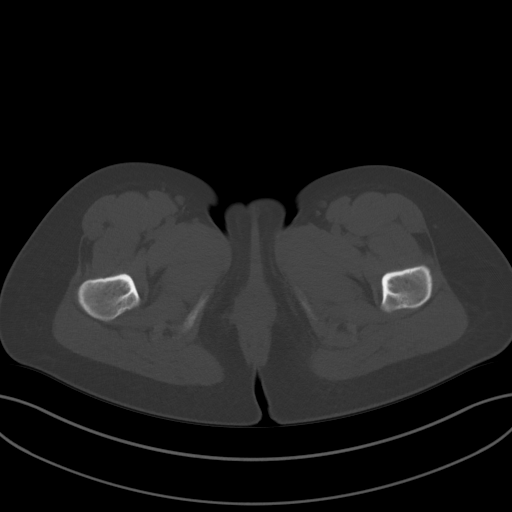
[im 12/92  soft-tissue]
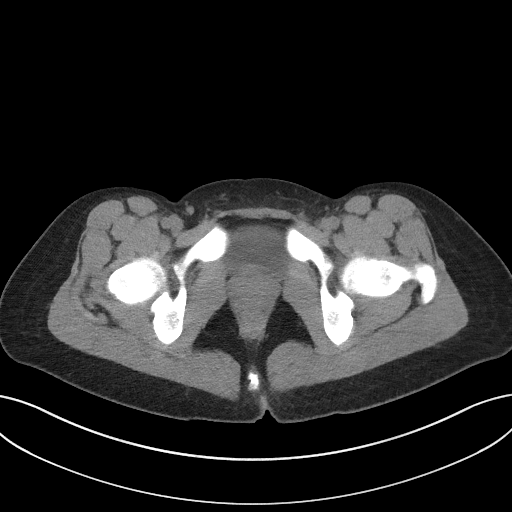
[im 16/92  soft-tissue]
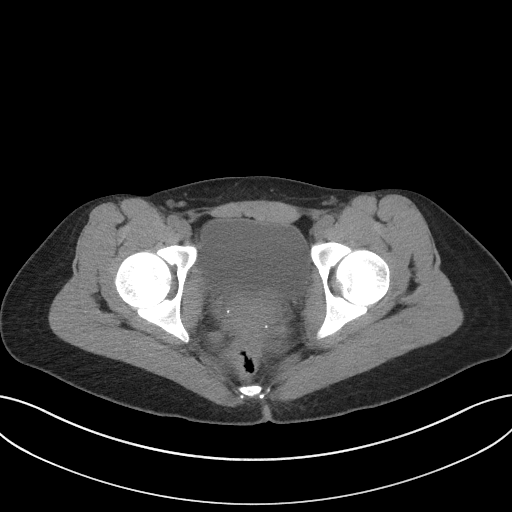
[im 24/92  soft-tissue]
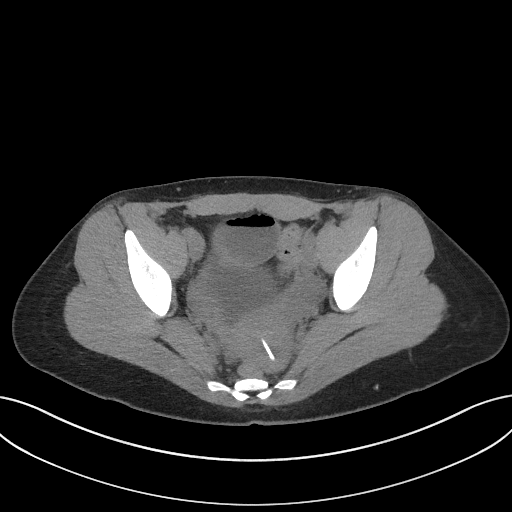
[im 32/92  soft-tissue]
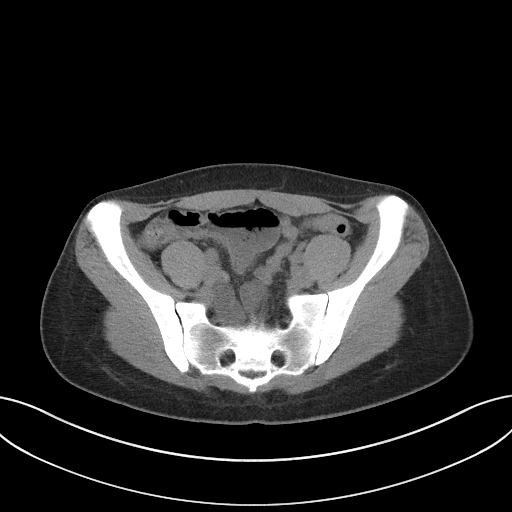
[im 36/92  soft-tissue]
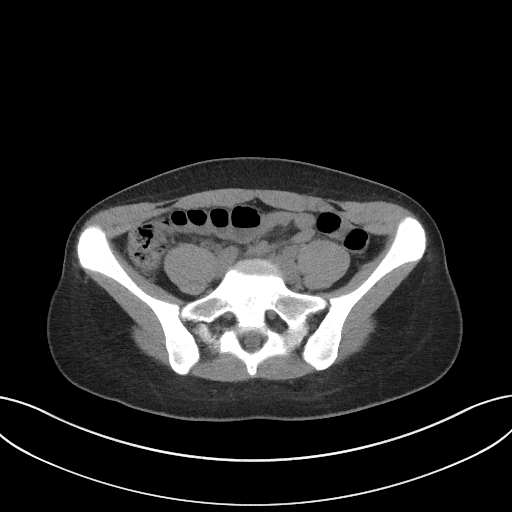
[im 44/92  soft-tissue]
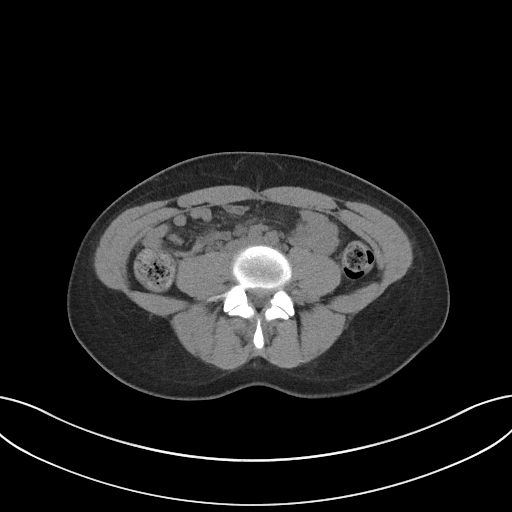
[im 48/92  soft-tissue]
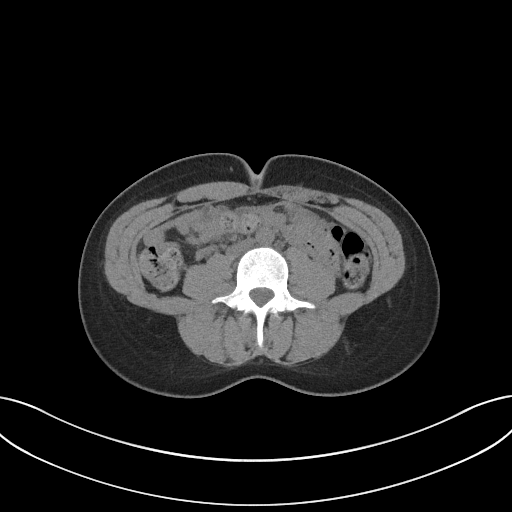
[im 56/92  soft-tissue]
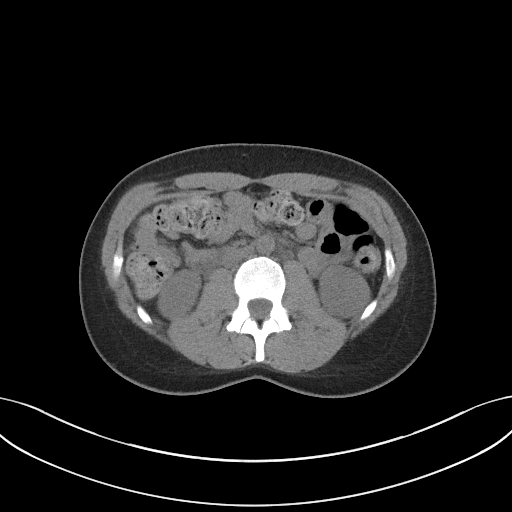
[im 56/92  bone]
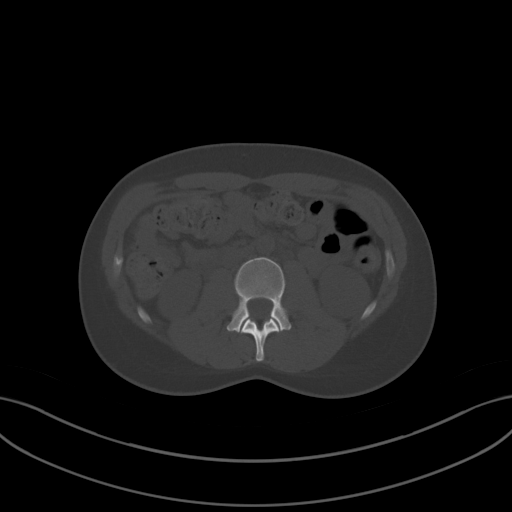
[im 60/92  soft-tissue]
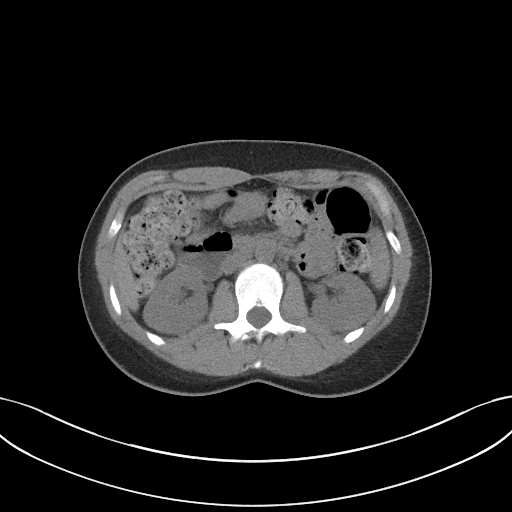
[im 68/92  soft-tissue]
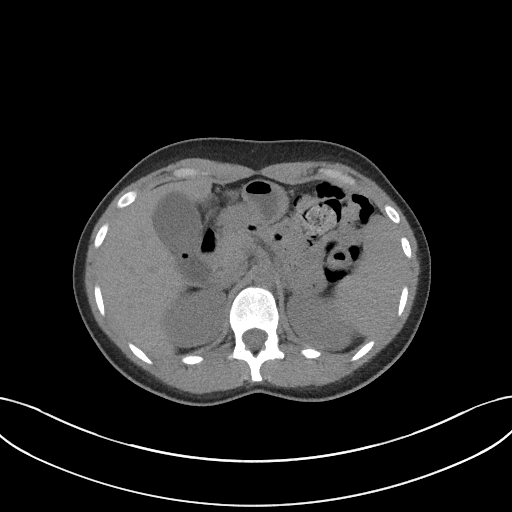
[im 76/92  soft-tissue]
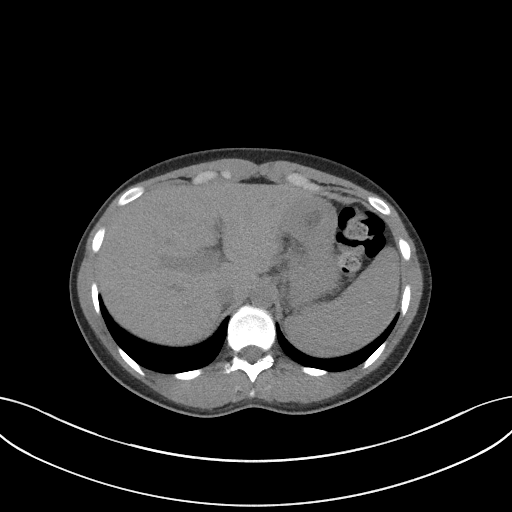
[im 80/92  soft-tissue]
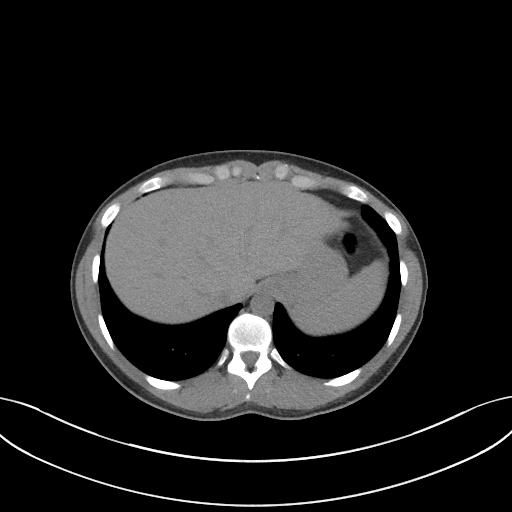
[im 88/92  soft-tissue]
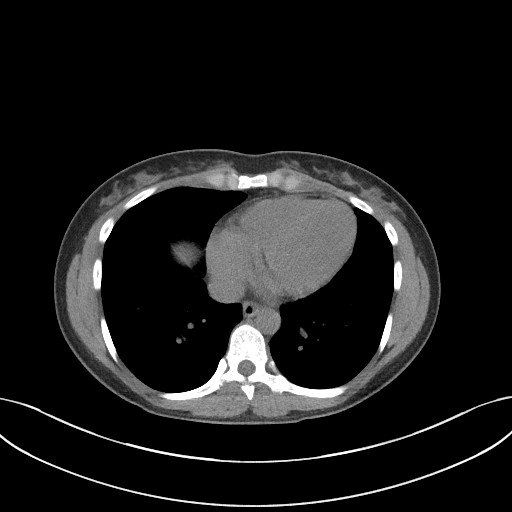

[Series 5: coronal st · coronal · 0.86mm/px · 3 of 91 slices shown]
[im 31/91  soft-tissue]
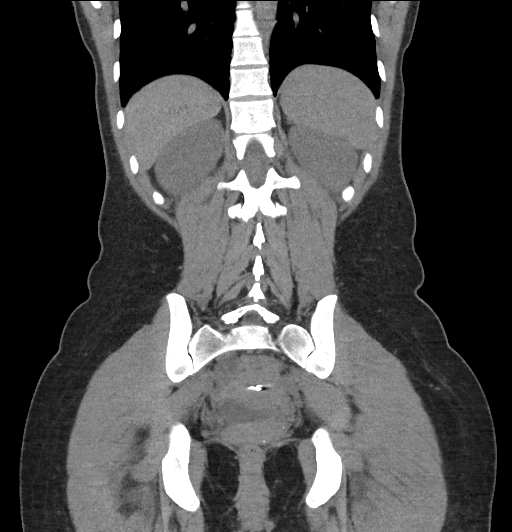
[im 41/91  soft-tissue]
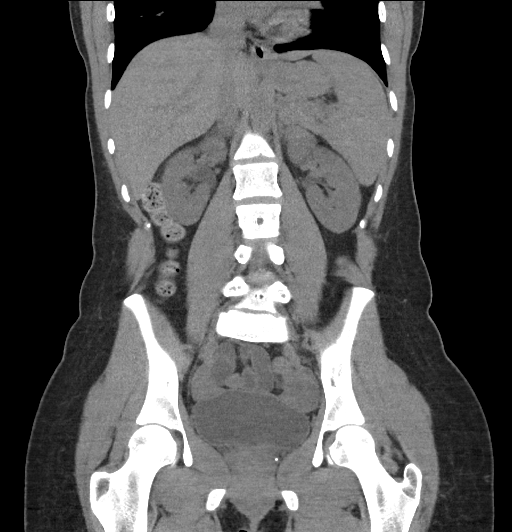
[im 51/91  soft-tissue]
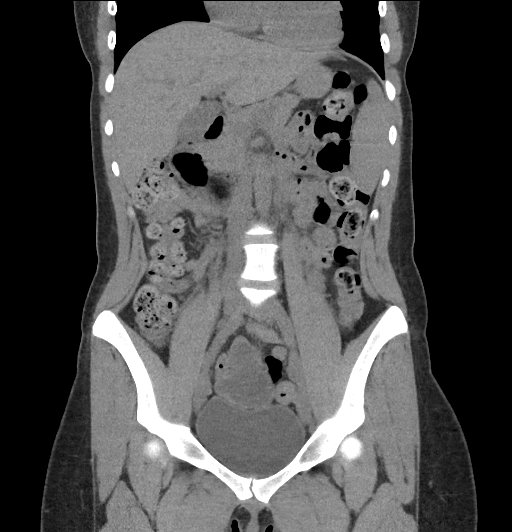

[17 of 46 positions shown; findings below may reference images not displayed]

FINDINGS: Lower chest: No acute abnormality.

Hepatobiliary: No focal liver abnormality is seen. No gallstones,
gallbladder wall thickening, or biliary dilatation.

Pancreas: Unremarkable. No pancreatic ductal dilatation or
surrounding inflammatory changes.

Spleen: Normal in size without focal abnormality.

Adrenals/Urinary Tract: Adrenal glands are unremarkable. Kidneys are
normal in size, without focal lesions. Mild right-sided
hydronephrosis and hydroureter are seen without obstructing renal
stones. A 3 mm nonobstructing renal stone is noted within the mid
left kidney. Bladder is unremarkable.

Stomach/Bowel: Stomach is within normal limits. Appendix appears
normal. No evidence of bowel wall thickening, distention, or
inflammatory changes.

Vascular/Lymphatic: No significant vascular findings are present. No
enlarged abdominal or pelvic lymph nodes.

Reproductive: An IUD is seen within an otherwise normal appearing
uterus. The bilateral adnexa are unremarkable.

Other: No abdominal wall hernia or abnormality. No abdominopelvic
ascites.

Musculoskeletal: No acute or significant osseous findings.
IMPRESSION: 1. Mild right-sided hydronephrosis and hydroureter without
obstructing renal stones.
2. 3 mm nonobstructing renal stone within the left kidney.
